# Patient Record
Sex: Male | Born: 1980 | Race: Black or African American | Hispanic: No | Marital: Married | Smoking: Current some day smoker
Health system: Southern US, Community
[De-identification: ages and names within clinical notes are randomized; demographics above are authoritative.]

## PROBLEM LIST (undated history)

## (undated) DIAGNOSIS — Z8619 Personal history of other infectious and parasitic diseases: Secondary | ICD-10-CM

## (undated) HISTORY — DX: Personal history of other infectious and parasitic diseases: Z86.19

## (undated) HISTORY — PX: PILONIDAL CYST EXCISION: SHX744

---

## 2004-06-20 ENCOUNTER — Emergency Department: Payer: Self-pay | Admitting: Emergency Medicine

## 2007-05-23 ENCOUNTER — Emergency Department: Payer: Self-pay | Admitting: Emergency Medicine

## 2007-05-25 ENCOUNTER — Emergency Department: Payer: Self-pay | Admitting: Emergency Medicine

## 2016-10-13 ENCOUNTER — Ambulatory Visit (INDEPENDENT_AMBULATORY_CARE_PROVIDER_SITE_OTHER): Payer: BLUE CROSS/BLUE SHIELD | Admitting: Internal Medicine

## 2016-10-13 ENCOUNTER — Encounter: Payer: Self-pay | Admitting: Internal Medicine

## 2016-10-13 VITALS — BP 120/80 | HR 71 | Temp 98.2°F | Wt 175.0 lb

## 2016-10-13 DIAGNOSIS — Z0000 Encounter for general adult medical examination without abnormal findings: Secondary | ICD-10-CM

## 2016-10-13 DIAGNOSIS — M79672 Pain in left foot: Secondary | ICD-10-CM

## 2016-10-13 DIAGNOSIS — K219 Gastro-esophageal reflux disease without esophagitis: Secondary | ICD-10-CM

## 2016-10-13 DIAGNOSIS — M79671 Pain in right foot: Secondary | ICD-10-CM

## 2016-10-13 LAB — CBC
HEMATOCRIT: 44.8 % (ref 38.5–50.0)
Hemoglobin: 15.1 g/dL (ref 13.2–17.1)
MCH: 32.3 pg (ref 27.0–33.0)
MCHC: 33.7 g/dL (ref 32.0–36.0)
MCV: 95.9 fL (ref 80.0–100.0)
MPV: 10 fL (ref 7.5–12.5)
Platelets: 228 10*3/uL (ref 140–400)
RBC: 4.67 MIL/uL (ref 4.20–5.80)
RDW: 12.2 % (ref 11.0–15.0)
WBC: 7.7 10*3/uL (ref 3.8–10.8)

## 2016-10-13 NOTE — Progress Notes (Signed)
HPI  Pt presents to the clinic today to establish care. [facility]  He c/o bilateral foot pain. This has been going on for years. The pain is in his bilateral big toes and under his arch. He has been seen for this in the past and told it was a combination of arthritis and plantar fasciitis. He is not taking anything OTC for this.  Flu: never Tetanus >10 years ago Dentist: as needed  Diet: He does eat meat. He consumes fruits and veggies daily. He tries to avoid fried foods. He drinks mostly water. Exercise: He goes to the gym 4-5 days a week.  Past Medical History:  Diagnosis Date  . History of shingles     Current Outpatient Prescriptions  Medication Sig Dispense Refill  . Multiple Vitamin (MULTIVITAMIN) tablet Take 1 tablet by mouth daily.     No current facility-administered medications for this visit.     No Known Allergies  No family history on file.  Social History   Social History  . Marital status: Married    Spouse name: N/A  . Number of children: N/A  . Years of education: N/A   Occupational History  . Not on file.   Social History Main Topics  . Smoking status: Never Smoker  . Smokeless tobacco: Never Used  . Alcohol use Yes     Comment: occasional  . Drug use: Unknown  . Sexual activity: Not on file   Other Topics Concern  . Not on file   Social History Narrative  . No narrative on file    ROS:  Constitutional: Denies fever, malaise, fatigue, headache or abrupt weight changes.  HEENT: Denies eye pain, eye redness, ear pain, ringing in the ears, wax buildup, runny nose, nasal congestion, bloody nose, or sore throat. Respiratory: Denies difficulty breathing, shortness of breath, cough or sputum production.   Cardiovascular: Denies chest pain, chest tightness, palpitations or swelling in the hands or feet.  Gastrointestinal: Pt reports reflux, daily. Denies abdominal pain, bloating, constipation, diarrhea or  blood in the stool.  GU: Denies frequency, urgency, pain with urination, blood in urine, odor or discharge. Musculoskeletal: Pt reports bilateral foot pain. Denies decrease in range of motion, difficulty with gait, muscle pain or joint swelling.  Skin: Denies redness, rashes, lesions or ulcercations.  Neurological: Denies dizziness, difficulty with memory, difficulty with speech or problems with balance and coordination.  Psych: Denies anxiety, depression, SI/HI.  No other specific complaints in a complete review of systems (except as listed in HPI above).  PE:  BP 120/80   Pulse 71   Temp 98.2 F (36.8 C) (Oral)   Wt 175 lb (79.4 kg)   SpO2 98%  Wt Readings from Last 3 Encounters:  10/13/16 175 lb (79.4 kg)    General: Appears his stated age, well developed, well nourished in NAD. HEENT: Head: normal shape and size; Eyes: sclera white, no icterus, conjunctiva pink, PERRLA and EOMs intact; Ears: Tm's gray and intact, normal light reflex; Throat/Mouth: Teeth present, mucosa pink and moist, no lesions or ulcerations noted.  Neck: Neck supple, trachea midline. No masses, lumps or thyromegaly present.  Cardiovascular: Normal rate and rhythm. S1,S2 noted.  No murmur, rubs or gallops noted.  Pulmonary/Chest: Normal effort and positive vesicular breath sounds. No respiratory distress. No wheezes, rales or ronchi noted.  Abdomen: Soft and nontender. Normal bowel sounds. No distention or masses noted. Liver, spleen and kidneys non palpable. Musculoskeletal: Strength 5/5 BUE/BLE. No  difficulty with gait.  Neurological: Alert and oriented. Cranial nerves II-XII grossly intact. Coordination normal.  Psychiatric: Mood and affect normal. Behavior is normal. Judgment and thought content normal.    Assessment and Plan:  Preventative Health Maintenance:  He declines flu or tetanus today Encouraged him to consume a balanced diet and exercise regimen Advised him to see a dentist  annually  GERD:  Try to identify triggers and avoid them Advised him to start Zantac 150 mg QHS  Bilateral Foot Pain:  Advised him to wear shoe inserts Take Aleve once daily  RTC in 1 year, sooner if needed Nicki Reaper, NP

## 2016-10-13 NOTE — Patient Instructions (Signed)

## 2016-10-14 LAB — COMPREHENSIVE METABOLIC PANEL
ALK PHOS: 44 U/L (ref 40–115)
ALT: 21 U/L (ref 9–46)
AST: 24 U/L (ref 10–40)
Albumin: 4.3 g/dL (ref 3.6–5.1)
BILIRUBIN TOTAL: 0.5 mg/dL (ref 0.2–1.2)
BUN: 13 mg/dL (ref 7–25)
CALCIUM: 9.3 mg/dL (ref 8.6–10.3)
CO2: 26 mmol/L (ref 20–31)
CREATININE: 1.38 mg/dL — AB (ref 0.60–1.35)
Chloride: 105 mmol/L (ref 98–110)
GLUCOSE: 79 mg/dL (ref 65–99)
Potassium: 4 mmol/L (ref 3.5–5.3)
SODIUM: 140 mmol/L (ref 135–146)
Total Protein: 7.6 g/dL (ref 6.1–8.1)

## 2016-10-14 LAB — LIPID PANEL
Cholesterol: 197 mg/dL (ref <200)
HDL: 45 mg/dL (ref >40)
LDL CALC: 136 mg/dL — AB (ref <100)
Total CHOL/HDL Ratio: 4.4 Ratio (ref <5.0)
Triglycerides: 79 mg/dL (ref <150)
VLDL: 16 mg/dL (ref <30)

## 2017-02-13 ENCOUNTER — Encounter: Payer: Self-pay | Admitting: Internal Medicine

## 2017-02-13 ENCOUNTER — Ambulatory Visit (INDEPENDENT_AMBULATORY_CARE_PROVIDER_SITE_OTHER): Payer: 59 | Admitting: Internal Medicine

## 2017-02-13 VITALS — BP 118/80 | HR 72 | Temp 98.2°F | Wt 187.0 lb

## 2017-02-13 DIAGNOSIS — R1033 Periumbilical pain: Secondary | ICD-10-CM

## 2017-02-13 DIAGNOSIS — R141 Gas pain: Secondary | ICD-10-CM | POA: Diagnosis not present

## 2017-02-13 DIAGNOSIS — R140 Abdominal distension (gaseous): Secondary | ICD-10-CM | POA: Diagnosis not present

## 2017-02-13 DIAGNOSIS — K219 Gastro-esophageal reflux disease without esophagitis: Secondary | ICD-10-CM

## 2017-02-13 LAB — COMPREHENSIVE METABOLIC PANEL
ALK PHOS: 48 U/L (ref 39–117)
ALT: 40 U/L (ref 0–53)
AST: 28 U/L (ref 0–37)
Albumin: 3.9 g/dL (ref 3.5–5.2)
BILIRUBIN TOTAL: 0.5 mg/dL (ref 0.2–1.2)
BUN: 10 mg/dL (ref 6–23)
CO2: 32 meq/L (ref 19–32)
CREATININE: 1.18 mg/dL (ref 0.40–1.50)
Calcium: 9.2 mg/dL (ref 8.4–10.5)
Chloride: 106 mEq/L (ref 96–112)
GFR: 89.46 mL/min (ref >60.00)
GLUCOSE: 93 mg/dL (ref 70–99)
Potassium: 4.1 mEq/L (ref 3.5–5.1)
SODIUM: 141 meq/L (ref 135–145)
TOTAL PROTEIN: 7.4 g/dL (ref 6.0–8.3)

## 2017-02-13 LAB — AMYLASE: Amylase: 51 U/L (ref 27–131)

## 2017-02-13 LAB — LIPASE: Lipase: 57 U/L (ref 11.0–59.0)

## 2017-02-13 LAB — H. PYLORI ANTIBODY, IGG: H Pylori IgG: POSITIVE — AB

## 2017-02-13 MED ORDER — OMEPRAZOLE 20 MG PO CPDR: 20.0000 mg | DELAYED_RELEASE_CAPSULE | Freq: Every day | ORAL | 0 refills | Status: DC

## 2017-02-13 NOTE — Progress Notes (Signed)
Subjective:    Patient ID: Cole Boyle, male    DOB: 17-Aug-1981, 36 y.o.   MRN: [id number]  HPI  Pt presents to the clinic today with c/o reflux. He reports this has been occurring after every meal. He thinks tomato's make it worse.He reports increase gas and bloating. He reports pain around his belly button that started 3 weeks ago. He describes the pain as sharp and stabbing. The pain radiates to the right and left sides of his abdomen. He denies nausea, vomiting, diarrhea, constipation or blood in his stool. He has tried Zantac with minimal relief. He denies changes in diet or medications.  Review of Systems   Past Medical History:  Diagnosis Date  . History of shingles     Current Outpatient Prescriptions  Medication Sig Dispense Refill  . Multiple Vitamin (MULTIVITAMIN) tablet Take 1 tablet by mouth daily.     No current facility-administered medications for this visit.     No Known Allergies  Family History  Problem Relation Age of Onset  . Diabetes Father   . Kidney disease Father   . Cancer Neg Hx   . Stroke Neg Hx   . Heart disease Neg Hx     Social History   Social History  . Marital status: Married    Spouse name: N/A  . Number of children: N/A  . Years of education: N/A   Occupational History  . Not on file.   Social History Main Topics  . Smoking status: Never Smoker  . Smokeless tobacco: Never Used  . Alcohol use Yes     Comment: occasional  . Drug use: No  . Sexual activity: Yes   Other Topics Concern  . Not on file   Social History Narrative  . No narrative on file     Constitutional: Denies fever, malaise, fatigue, headache or abrupt weight changes.   Gastrointestinal: Pt reports reflux and abdominal pain. Denies bloating, constipation, diarrhea or blood in the stool.    No other specific complaints in a complete review of systems (except as listed in HPI above).  Objective:   Physical Exam   BP 118/80   Pulse 72   Temp  98.2 F (36.8 C) (Oral)   Wt 187 lb (84.8 kg)   SpO2 98%  Wt Readings from Last 3 Encounters:  02/13/17 187 lb (84.8 kg)  10/13/16 175 lb (79.4 kg)    General: Appears his stated age, in NAD. Abdomen: Soft and tender just left of the umbilicus. Hyperactive bowel sounds. No distention or masses noted.   BMET    Component Value Date/Time   NA 140 10/13/2016 1545   K 4.0 10/13/2016 1545   CL 105 10/13/2016 1545   CO2 26 10/13/2016 1545   GLUCOSE 79 10/13/2016 1545   BUN 13 10/13/2016 1545   CREATININE 1.38 (H) 10/13/2016 1545   CALCIUM 9.3 10/13/2016 1545    Lipid Panel     Component Value Date/Time   CHOL 197 10/13/2016 1545   TRIG 79 10/13/2016 1545   HDL 45 10/13/2016 1545   CHOLHDL 4.4 10/13/2016 1545   VLDL 16 10/13/2016 1545   LDLCALC 136 (H) 10/13/2016 1545    CBC    Component Value Date/Time   WBC 7.7 10/13/2016 1545   RBC 4.67 10/13/2016 1545   HGB 15.1 10/13/2016 1545   HCT 44.8 10/13/2016 1545   PLT 228 10/13/2016 1545   MCV 95.9 10/13/2016 1545   MCH 32.3 10/13/2016 1545  MCHC 33.7 10/13/2016 1545   RDW 12.2 10/13/2016 1545    Hgb A1C No results found for: HGBA1C         Assessment & Plan:   Bloating, Gas, Periumbilical Abdominal Pain and GERD:  Will check CMET, Amylase, Lipase and H Pylori today eRx for Prilosec 20 mg daily x 14 days Avoid tomato based foods Keep a food diary so that you can identify any other possible triggers  Will follow up after labs, RTC as needed or if symptoms persist or worsen Javyn Havlin, NP

## 2017-02-13 NOTE — Patient Instructions (Signed)

## 2017-02-14 ENCOUNTER — Other Ambulatory Visit: Payer: Self-pay | Admitting: Internal Medicine

## 2017-02-14 MED ORDER — PANTOPRAZOLE SODIUM 40 MG PO TBEC: 40.0000 mg | DELAYED_RELEASE_TABLET | Freq: Two times a day (BID) | ORAL | 0 refills | Status: DC

## 2017-02-14 MED ORDER — METRONIDAZOLE 500 MG PO TABS: 500.0000 mg | ORAL_TABLET | Freq: Three times a day (TID) | ORAL | 0 refills | Status: AC

## 2017-02-14 MED ORDER — CLARITHROMYCIN 500 MG PO TABS: 500.0000 mg | ORAL_TABLET | Freq: Two times a day (BID) | ORAL | 0 refills | Status: DC

## 2017-02-20 ENCOUNTER — Telehealth: Payer: Self-pay | Admitting: Internal Medicine

## 2017-02-20 NOTE — Telephone Encounter (Signed)
Patient received Melanie's message about his lab work.  Patient said he did start the medication immediately.

## 2017-03-20 ENCOUNTER — Ambulatory Visit: Payer: 59 | Admitting: Internal Medicine

## 2017-03-20 NOTE — Progress Notes (Deleted)
   Subjective:    Patient ID: Cole Boyle, male    DOB: [birth year], 36 y.o.   MRN: [id number]  HPI  Pt presents to the clinic today to follow up reflux. He was treated for H Pylori 02/2017.  Review of Systems      Past Medical History:  Diagnosis Date  . History of shingles     Current Outpatient Prescriptions  Medication Sig Dispense Refill  . clarithromycin (BIAXIN) 500 MG tablet Take 1 tablet (500 mg total) by mouth 2 (two) times daily. 28 tablet 0  . Multiple Vitamin (MULTIVITAMIN) tablet Take 1 tablet by mouth daily.    . pantoprazole (PROTONIX) 40 MG tablet Take 1 tablet (40 mg total) by mouth 2 (two) times daily. 28 tablet 0  . Protein POWD Take 1 scoop by mouth every other day.     No current facility-administered medications for this visit.     No Known Allergies  Family History  Problem Relation Age of Onset  . Diabetes Father   . Kidney disease Father   . Cancer Neg Hx   . Stroke Neg Hx   . Heart disease Neg Hx     Social History   Social History  . Marital status: Married    Spouse name: N/A  . Number of children: N/A  . Years of education: N/A   Occupational History  . Not on file.   Social History Main Topics  . Smoking status: Never Smoker  . Smokeless tobacco: Never Used  . Alcohol use Yes     Comment: occasional  . Drug use: No  . Sexual activity: Yes   Other Topics Concern  . Not on file   Social History Narrative  . No narrative on file     Constitutional: Denies fever, malaise, fatigue, headache or abrupt weight changes.  HEENT: Denies eye pain, eye redness, ear pain, ringing in the ears, wax buildup, runny nose, nasal congestion, bloody nose, or sore throat. Respiratory: Pt reports cough. Denies difficulty breathing, shortness of breath, or sputum production.   Cardiovascular: Denies chest pain, chest tightness, palpitations or swelling in the hands or feet.  Gastrointestinal: Pt reports reflux. Denies abdominal pain,  bloating, constipation, diarrhea or blood in the stool.  GU: Denies urgency, frequency, pain with urination, burning sensation, blood in urine, odor or discharge. Musculoskeletal: Denies decrease in range of motion, difficulty with gait, muscle pain or joint pain and swelling.  Skin: Denies redness, rashes, lesions or ulcercations.  Neurological: Denies dizziness, difficulty with memory, difficulty with speech or problems with balance and coordination.  Psych: Denies anxiety, depression, SI/HI.  No other specific complaints in a complete review of systems (except as listed in HPI above).  Objective:   Physical Exam        Assessment & Plan:

## 2017-03-23 ENCOUNTER — Encounter: Payer: Self-pay | Admitting: Internal Medicine

## 2017-03-23 ENCOUNTER — Ambulatory Visit (INDEPENDENT_AMBULATORY_CARE_PROVIDER_SITE_OTHER): Payer: 59 | Admitting: Internal Medicine

## 2017-03-23 VITALS — BP 120/80 | HR 65 | Temp 97.8°F | Wt 186.8 lb

## 2017-03-23 DIAGNOSIS — K219 Gastro-esophageal reflux disease without esophagitis: Secondary | ICD-10-CM | POA: Diagnosis not present

## 2017-03-23 DIAGNOSIS — A048 Other specified bacterial intestinal infections: Secondary | ICD-10-CM

## 2017-03-23 MED ORDER — PANTOPRAZOLE SODIUM 20 MG PO TBEC: 20.0000 mg | DELAYED_RELEASE_TABLET | Freq: Every day | ORAL | 2 refills | Status: DC

## 2017-03-23 MED ORDER — OMEPRAZOLE 20 MG PO CPDR: 20.0000 mg | DELAYED_RELEASE_CAPSULE | Freq: Every day | ORAL | 2 refills | Status: DC

## 2017-03-23 NOTE — Addendum Note (Signed)
Addended by: Roena MaladyEVONTENNO, Jance Siek Y on: 03/23/2017 05:25 PM   Modules accepted: Orders

## 2017-03-23 NOTE — Progress Notes (Signed)
Subjective:    Patient ID: Cole Boyle, male    DOB: 1980-11-21, 36 y.o.   MRN: [id number]  HPI  Pt presents to the clinic today to follow up GERD. He was seen 02/20/17 for the same. He was treated for H Pylori at that time with Clarithromycin, Flagyl and Protonix. Since that time, he reports he overall feels better. He has a slight sore throat and dry non productive cough. He denies runny nose, nasal congestion, post nasal drip or shortness of breath. He denies nausea, vomiting, diarrhea or constipation. He has not taken anything additional OTC. He is requesting repeat testing to see if the H Pylori is gone.  Review of Systems      Past Medical History:  Diagnosis Date  . History of shingles     Current Outpatient Prescriptions  Medication Sig Dispense Refill  . clarithromycin (BIAXIN) 500 MG tablet Take 1 tablet (500 mg total) by mouth 2 (two) times daily. 28 tablet 0  . Multiple Vitamin (MULTIVITAMIN) tablet Take 1 tablet by mouth daily.    . pantoprazole (PROTONIX) 40 MG tablet Take 1 tablet (40 mg total) by mouth 2 (two) times daily. 28 tablet 0  . Protein POWD Take 1 scoop by mouth every other day.     No current facility-administered medications for this visit.     No Known Allergies  Family History  Problem Relation Age of Onset  . Diabetes Father   . Kidney disease Father   . Cancer Neg Hx   . Stroke Neg Hx   . Heart disease Neg Hx     Social History   Social History  . Marital status: Married    Spouse name: N/A  . Number of children: N/A  . Years of education: N/A   Occupational History  . Not on file.   Social History Main Topics  . Smoking status: Never Smoker  . Smokeless tobacco: Never Used  . Alcohol use Yes     Comment: occasional  . Drug use: No  . Sexual activity: Yes   Other Topics Concern  . Not on file   Social History Narrative  . No narrative on file     Constitutional: Denies fever, malaise, fatigue, headache or abrupt  weight changes.  HEENT: Pt reports sore throat. Denies eye pain, eye redness, ear pain, ringing in the ears, wax buildup, runny nose, nasal congestion, bloody nose. Respiratory: Pt reports cough. Denies difficulty breathing, shortness of breath, or sputum production.   Cardiovascular: Denies chest pain, chest tightness, palpitations or swelling in the hands or feet.  Gastrointestinal: Pt reports reflux. Denies abdominal pain, bloating, constipation, diarrhea or blood in the stool.   No other specific complaints in a complete review of systems (except as listed in HPI above).  Objective:   Physical Exam   BP 120/80   Pulse 65   Temp 97.8 F (36.6 C) (Oral)   Wt 186 lb 12 oz (84.7 kg)   SpO2 98%  Wt Readings from Last 3 Encounters:  03/23/17 186 lb 12 oz (84.7 kg)  02/13/17 187 lb (84.8 kg)  10/13/16 175 lb (79.4 kg)    General: Appears his stated age, in NAD. HEENT:  Throat/Mouth: Teeth present, mucosa pink and moist, no exudate, lesions or ulcerations noted.  Neck:  No adenopathy noted. Cardiovascular: Normal rate and rhythm.  Pulmonary/Chest: Normal effort and positive vesicular breath sounds. No respiratory distress. No wheezes, rales or ronchi noted.  Abdomen: Soft and nontender.  Normal bowel sounds. No distention or masses noted.    BMET    Component Value Date/Time   NA 141 02/13/2017 1147   K 4.1 02/13/2017 1147   CL 106 02/13/2017 1147   CO2 32 02/13/2017 1147   GLUCOSE 93 02/13/2017 1147   BUN 10 02/13/2017 1147   CREATININE 1.18 02/13/2017 1147   CREATININE 1.38 (H) 10/13/2016 1545   CALCIUM 9.2 02/13/2017 1147    Lipid Panel     Component Value Date/Time   CHOL 197 10/13/2016 1545   TRIG 79 10/13/2016 1545   HDL 45 10/13/2016 1545   CHOLHDL 4.4 10/13/2016 1545   VLDL 16 10/13/2016 1545   LDLCALC 136 (H) 10/13/2016 1545    CBC    Component Value Date/Time   WBC 7.7 10/13/2016 1545   RBC 4.67 10/13/2016 1545   HGB 15.1 10/13/2016 1545   HCT 44.8  10/13/2016 1545   PLT 228 10/13/2016 1545   MCV 95.9 10/13/2016 1545   MCH 32.3 10/13/2016 1545   MCHC 33.7 10/13/2016 1545   RDW 12.2 10/13/2016 1545    Hgb A1C No results found for: HGBA1C         Assessment & Plan:   GERD:  Will start Prilosec 20 mg daily, eRx sent to pharmacy Take 30 minutes before meals  H Pylori:  Referral placed to GI for followup He may need H Pylori breath test vs UGI with biopsy  Return precautions discussed Nicki Reaper, NP

## 2017-03-23 NOTE — Patient Instructions (Signed)
Helicobacter Pylori Infection  Helicobacter pylori infection is an infection in the stomach that is caused by the Helicobacter pylori (H. pylori) bacteria. This type of bacteria often lives in the lining of the stomach. The infection can cause ulcers and irritation (gastritis) in some people. It is the most common cause of ulcers in the stomach (gastric ulcer) and in the upper part of the intestine (duodenal ulcer). Having this infection may also increase the risk of stomach cancer and a type of white blood cell cancer (lymphoma) that affects the stomach.  What are the causes?  H. pylori is a type of bacteria that is often found in the stomachs of healthy people. The bacteria may be passed from person to person through contact with stool or saliva. It is not known why some people develop ulcers, gastritis, or cancer from the infection.  What increases the risk?  This condition is more likely to develop in people who:  · Have family members with the infection.  · Live with many other people, such as in a dormitory.  · Are of African, Hispanic, or Asian descent.     What are the signs or symptoms?  Most people with this infection do not have symptoms. If you do have symptoms, they may include:  · Heartburn.  · Stomach pain.  · Nausea.  · Vomiting.  · Blood-tinged vomit.  · Loss of appetite.  · Bad breath.     How is this diagnosed?  This condition may be diagnosed based on your symptoms, a physical exam, and various tests. Tests may include:  · Blood tests or stool tests to check for the proteins (antibodies) that your body may produce in response to the bacteria. These tests are the best way to confirm the diagnosis.  · A breath test to check for the type of gas that the H. pylori bacteria release after breaking down a substance called urea. For the test, you are asked to drink urea. This test is often done after treatment in order to find out if the treatment worked.  · A procedure in which a thin, flexible tube  with a tiny camera at the end is placed into your stomach and upper intestine (upper endoscopy). Your health care provider may also take tissue samples (biopsy) to test for H. pylori and cancer.     How is this treated?  Treatment for this condition usually involves taking a combination of medicines (triple therapy) for a couple of weeks. Triple therapy includes one medicine to reduce the acid in your stomach and two types of antibiotic medicines. Many drug combinations have been approved for treatment. Treatment usually kills the H. pylori and reduces your risk of cancer. You may need to be tested for H. pylori again after treatment. In some cases, the treatment may need to be repeated.  Follow these instructions at home:  ·   · Take over-the-counter and prescription medicines only as told by your health care provider.  · Take your antibiotics as told by your health care provider. Do not stop taking the antibiotics even if you start to feel better.  · You can do all your usual activities and eat what you usually do.  · Take steps to prevent future infections:  ? Wash your hands often.  ? Make sure the food you eat has been properly prepared.  ? Drink water only from clean sources.  · Keep all follow-up visits as told by your health care provider. This is important.  Contact   a health care provider if:  · Your symptoms do not get better.  · Your symptoms return after treatment.  This information is not intended to replace advice given to you by your health care provider. Make sure you discuss any questions you have with your health care provider.  Document Released: 12/13/2015 Document Revised: 01/27/2016 Document Reviewed: 09/02/2014  [employer]vier Interactive Patient Education © 2018 [employer]   

## 2017-03-28 ENCOUNTER — Encounter: Payer: Self-pay | Admitting: Gastroenterology

## 2017-05-09 ENCOUNTER — Encounter: Payer: Self-pay | Admitting: Gastroenterology

## 2017-07-30 ENCOUNTER — Telehealth: Payer: Self-pay | Admitting: Internal Medicine

## 2017-07-30 NOTE — Telephone Encounter (Signed)
Referral was placed in July, after multiple phone calls and also a letter from [facility], the patient has not responded at all to any requests for a call back about the GI referral. I am cancelling the Referral at this time. If patient calls in we can refer him back.

## 2017-07-30 NOTE — Telephone Encounter (Signed)
noted 

## 2017-12-04 ENCOUNTER — Other Ambulatory Visit: Payer: 59

## 2017-12-04 ENCOUNTER — Ambulatory Visit (INDEPENDENT_AMBULATORY_CARE_PROVIDER_SITE_OTHER): Payer: Managed Care, Other (non HMO) | Admitting: Internal Medicine

## 2017-12-04 ENCOUNTER — Encounter: Payer: Self-pay | Admitting: Internal Medicine

## 2017-12-04 VITALS — BP 116/78 | HR 54 | Temp 97.9°F | Ht 69.0 in | Wt 194.0 lb

## 2017-12-04 DIAGNOSIS — Z23 Encounter for immunization: Secondary | ICD-10-CM

## 2017-12-04 DIAGNOSIS — Z0000 Encounter for general adult medical examination without abnormal findings: Secondary | ICD-10-CM | POA: Diagnosis not present

## 2017-12-04 LAB — LIPID PANEL
CHOL/HDL RATIO: 5
CHOLESTEROL: 200 mg/dL (ref 0–200)
HDL: 41.4 mg/dL (ref >39.00)
LDL CALC: 137 mg/dL — AB (ref 0–99)
NonHDL: 158.63
TRIGLYCERIDES: 109 mg/dL (ref 0.0–149.0)
VLDL: 21.8 mg/dL (ref 0.0–40.0)

## 2017-12-04 LAB — CBC
HEMATOCRIT: 44 % (ref 39.0–52.0)
HEMOGLOBIN: 14.9 g/dL (ref 13.0–17.0)
MCHC: 33.8 g/dL (ref 30.0–36.0)
MCV: 95.2 fl (ref 78.0–100.0)
PLATELETS: 258 10*3/uL (ref 150.0–400.0)
RBC: 4.63 Mil/uL (ref 4.22–5.81)
RDW: 12.4 % (ref 11.5–15.5)
WBC: 6.4 10*3/uL (ref 4.0–10.5)

## 2017-12-04 LAB — COMPREHENSIVE METABOLIC PANEL
ALBUMIN: 4 g/dL (ref 3.5–5.2)
ALT: 35 U/L (ref 0–53)
AST: 27 U/L (ref 0–37)
Alkaline Phosphatase: 52 U/L (ref 39–117)
BUN: 9 mg/dL (ref 6–23)
CALCIUM: 9.5 mg/dL (ref 8.4–10.5)
CHLORIDE: 102 meq/L (ref 96–112)
CO2: 31 meq/L (ref 19–32)
Creatinine, Ser: 1.04 mg/dL (ref 0.40–1.50)
GFR: 103.04 mL/min (ref >60.00)
Glucose, Bld: 79 mg/dL (ref 70–99)
POTASSIUM: 3.5 meq/L (ref 3.5–5.1)
Sodium: 139 mEq/L (ref 135–145)
Total Bilirubin: 0.5 mg/dL (ref 0.2–1.2)
Total Protein: 7.8 g/dL (ref 6.0–8.3)

## 2017-12-04 NOTE — Patient Instructions (Signed)

## 2017-12-04 NOTE — Progress Notes (Signed)
Subjective:    Patient ID: Cole Boyle, male    DOB: [birth year], 37 y.o.   MRN: [id number]  HPI  Pt presents to the clinic today for his annual exam.  Flu: never Tetanus: > 10 years ago Dentist: annually  Diet: He does eat meat. He consumes fruits and veggies daily. He does eat fried food. He drinks mostly coffee and water. Exercise: He does P90X a few days per week  Review of Systems      Past Medical History:  Diagnosis Date  . History of shingles     Current Outpatient Medications  Medication Sig Dispense Refill  . Multiple Vitamin (MULTIVITAMIN) tablet Take 1 tablet by mouth daily.    . pantoprazole (PROTONIX) 20 MG tablet Take 1 tablet (20 mg total) by mouth daily. 30 tablet 2  . Protein POWD Take 1 scoop by mouth every other day.     No current facility-administered medications for this visit.     No Known Allergies  Family History  Problem Relation Age of Onset  . Diabetes Father   . Kidney disease Father   . Cancer Neg Hx   . Stroke Neg Hx   . Heart disease Neg Hx     Social History   Socioeconomic History  . Marital status: Married    Spouse name: Not on file  . Number of children: Not on file  . Years of education: Not on file  . Highest education level: Not on file  Occupational History  . Not on file  Social Needs  . Financial resource strain: Not on file  . Food insecurity:    Worry: Not on file    Inability: Not on file  . Transportation needs:    Medical: Not on file    Non-medical: Not on file  Tobacco Use  . Smoking status: Never Smoker  . Smokeless tobacco: Never Used  Substance and Sexual Activity  . Alcohol use: Yes    Comment: occasional  . Drug use: No  . Sexual activity: Yes  Lifestyle  . Physical activity:    Days per week: Not on file    Minutes per session: Not on file  . Stress: Not on file  Relationships  . Social connections:    Talks on phone: Not on file    Gets together: Not on file    Attends  religious service: Not on file    Active member of club or organization: Not on file    Attends meetings of clubs or organizations: Not on file    Relationship status: Not on file  . Intimate partner violence:    Fear of current or ex partner: Not on file    Emotionally abused: Not on file    Physically abused: Not on file    Forced sexual activity: Not on file  Other Topics Concern  . Not on file  Social History Narrative  . Not on file     Constitutional: Denies fever, malaise, fatigue, headache or abrupt weight changes.  HEENT: Denies eye pain, eye redness, ear pain, ringing in the ears, wax buildup, runny nose, nasal congestion, bloody nose, or sore throat. Respiratory: Denies difficulty breathing, shortness of breath, cough or sputum production.   Cardiovascular: Denies chest pain, chest tightness, palpitations or swelling in the hands or feet.  Gastrointestinal: Denies abdominal pain, bloating, constipation, diarrhea or blood in the stool.  GU: Denies urgency, frequency, pain with urination, burning sensation, blood in urine, odor or  discharge. Musculoskeletal: Denies decrease in range of motion, difficulty with gait, muscle pain or joint pain and swelling.  Skin: Denies redness, rashes, lesions or ulcercations.  Neurological: Denies dizziness, difficulty with memory, difficulty with speech or problems with balance and coordination.  Psych: Denies anxiety, depression, SI/HI.  No other specific complaints in a complete review of systems (except as listed in HPI above).  Objective:   Physical Exam   BP 116/78   Pulse (!) 54   Temp 97.9 F (36.6 C) (Oral)   Ht 5\' 9"  (1.753 m)   Wt 194 lb (88 kg)   SpO2 98%   BMI 28.65 kg/m  Wt Readings from Last 3 Encounters:  12/04/17 194 lb (88 kg)  03/23/17 186 lb 12 oz (84.7 kg)  02/13/17 187 lb (84.8 kg)    General: Appears his stated age, well developed, well nourished in NAD. Skin: Warm, dry and intact.  HEENT: Head: normal  shape and size; Eyes: sclera white, no icterus, conjunctiva pink, PERRLA and EOMs intact; Ears: Tm's gray and intact, normal light reflex; Throat/Mouth: Teeth present, mucosa pink and moist, no exudate, lesions or ulcerations noted.  Neck:  Neck supple, trachea midline. No masses, lumps or thyromegaly present.  Cardiovascular: Normal rate and rhythm. S1,S2 noted.  No murmur, rubs or gallops noted. No JVD or BLE edema.  Pulmonary/Chest: Normal effort and positive vesicular breath sounds. No respiratory distress. No wheezes, rales or ronchi noted.  Abdomen: Soft and nontender. Normal bowel sounds. No distention or masses noted. Liver, spleen and kidneys non palpable. Musculoskeletal: Strength 5/5 BUE/BLE. No difficulty with gait.  Neurological: Alert and oriented. Cranial nerves II-XII grossly intact. Coordination normal.  Psychiatric: Mood and affect normal. Behavior is normal. Judgment and thought content normal.     BMET    Component Value Date/Time   NA 141 02/13/2017 1147   K 4.1 02/13/2017 1147   CL 106 02/13/2017 1147   CO2 32 02/13/2017 1147   GLUCOSE 93 02/13/2017 1147   BUN 10 02/13/2017 1147   CREATININE 1.18 02/13/2017 1147   CREATININE 1.38 (H) 10/13/2016 1545   CALCIUM 9.2 02/13/2017 1147    Lipid Panel     Component Value Date/Time   CHOL 197 10/13/2016 1545   TRIG 79 10/13/2016 1545   HDL 45 10/13/2016 1545   CHOLHDL 4.4 10/13/2016 1545   VLDL 16 10/13/2016 1545   LDLCALC 136 (H) 10/13/2016 1545    CBC    Component Value Date/Time   WBC 7.7 10/13/2016 1545   RBC 4.67 10/13/2016 1545   HGB 15.1 10/13/2016 1545   HCT 44.8 10/13/2016 1545   PLT 228 10/13/2016 1545   MCV 95.9 10/13/2016 1545   MCH 32.3 10/13/2016 1545   MCHC 33.7 10/13/2016 1545   RDW 12.2 10/13/2016 1545    Hgb A1C No results found for: HGBA1C         Assessment & Plan:   Preventative Health Maintenance:  He declines flu shot today Tdap today Encouraged him to consume a  balanced diet and exercise regimen Advised him to see a dentist annually Will check CBC, CMET, Lipid profile today  RTC in 1 year, sooner if needed Nicki Reaper, NP

## 2017-12-07 NOTE — Addendum Note (Signed)
Addended by: Roena MaladyEVONTENNO, Kerston Landeck Y on: 12/07/2017 10:26 AM   Modules accepted: Orders

## 2018-09-09 DIAGNOSIS — L720 Epidermal cyst: Secondary | ICD-10-CM | POA: Diagnosis not present

## 2018-12-09 ENCOUNTER — Encounter: Payer: Managed Care, Other (non HMO) | Admitting: Internal Medicine

## 2019-01-16 ENCOUNTER — Ambulatory Visit (INDEPENDENT_AMBULATORY_CARE_PROVIDER_SITE_OTHER): Payer: 59 | Admitting: Internal Medicine

## 2019-01-16 ENCOUNTER — Encounter: Payer: Self-pay | Admitting: Internal Medicine

## 2019-01-16 ENCOUNTER — Other Ambulatory Visit: Payer: Self-pay

## 2019-01-16 VITALS — BP 118/76 | HR 72 | Temp 98.2°F | Wt 190.0 lb

## 2019-01-16 DIAGNOSIS — R1031 Right lower quadrant pain: Secondary | ICD-10-CM | POA: Diagnosis not present

## 2019-01-16 LAB — COMPREHENSIVE METABOLIC PANEL
ALT: 24 U/L (ref 0–53)
AST: 22 U/L (ref 0–37)
Albumin: 4 g/dL (ref 3.5–5.2)
Alkaline Phosphatase: 50 U/L (ref 39–117)
BUN: 10 mg/dL (ref 6–23)
CO2: 28 mEq/L (ref 19–32)
Calcium: 9 mg/dL (ref 8.4–10.5)
Chloride: 104 mEq/L (ref 96–112)
Creatinine, Ser: 1.21 mg/dL (ref 0.40–1.50)
GFR: 80.92 mL/min (ref >60.00)
Glucose, Bld: 94 mg/dL (ref 70–99)
Potassium: 3.5 mEq/L (ref 3.5–5.1)
Sodium: 139 mEq/L (ref 135–145)
Total Bilirubin: 0.5 mg/dL (ref 0.2–1.2)
Total Protein: 7.3 g/dL (ref 6.0–8.3)

## 2019-01-16 LAB — POC URINALSYSI DIPSTICK (AUTOMATED)
Bilirubin, UA: NEGATIVE
Blood, UA: NEGATIVE
Glucose, UA: NEGATIVE
Nitrite, UA: NEGATIVE
Protein, UA: NEGATIVE
Spec Grav, UA: 1.025 (ref 1.010–1.025)
Urobilinogen, UA: 0.2 E.U./dL
pH, UA: 6 (ref 5.0–8.0)

## 2019-01-16 LAB — CBC
HCT: 41.2 % (ref 39.0–52.0)
Hemoglobin: 14.1 g/dL (ref 13.0–17.0)
MCHC: 34.2 g/dL (ref 30.0–36.0)
MCV: 94.7 fl (ref 78.0–100.0)
Platelets: 238 10*3/uL (ref 150.0–400.0)
RBC: 4.35 Mil/uL (ref 4.22–5.81)
RDW: 12.3 % (ref 11.5–15.5)
WBC: 6.4 10*3/uL (ref 4.0–10.5)

## 2019-01-16 LAB — LIPASE: Lipase: 45 U/L (ref 11.0–59.0)

## 2019-01-16 LAB — AMYLASE: Amylase: 57 U/L (ref 27–131)

## 2019-01-16 NOTE — Addendum Note (Signed)
Addended by: Roena Malady on: 01/16/2019 04:36 PM   Modules accepted: Orders

## 2019-01-16 NOTE — Progress Notes (Signed)
Subjective:    Patient ID: Cole Boyle, male    DOB: [birth year], 38 y.o.   MRN: [id number]  HPI  Pt presents to the clinic today with c/o right lower abdominal pain. This started 2 days ago. He describes the pain as sharp and stabbing. The pain does not radiate. It is not tender to touch. He denies nausea, vomiting, diarrhea, constipation or blood in his stool. He denies urinary complaints. He denies any injury to the area. He has not taken anything OTC for this.  Review of Systems      Past Medical History:  Diagnosis Date  . History of shingles     Current Outpatient Medications  Medication Sig Dispense Refill  . Multiple Vitamin (MULTIVITAMIN) tablet Take 1 tablet by mouth daily.     No current facility-administered medications for this visit.     No Known Allergies  Family History  Problem Relation Age of Onset  . Diabetes Father   . Kidney disease Father   . Cancer Neg Hx   . Stroke Neg Hx   . Heart disease Neg Hx     Social History   Socioeconomic History  . Marital status: Married    Spouse name: Not on file  . Number of children: Not on file  . Years of education: Not on file  . Highest education level: Not on file  Occupational History  . Not on file  Social Needs  . Financial resource strain: Not on file  . Food insecurity:    Worry: Not on file    Inability: Not on file  . Transportation needs:    Medical: Not on file    Non-medical: Not on file  Tobacco Use  . Smoking status: Never Smoker  . Smokeless tobacco: Never Used  Substance and Sexual Activity  . Alcohol use: Yes    Comment: occasional  . Drug use: No  . Sexual activity: Yes  Lifestyle  . Physical activity:    Days per week: Not on file    Minutes per session: Not on file  . Stress: Not on file  Relationships  . Social connections:    Talks on phone: Not on file    Gets together: Not on file    Attends religious service: Not on file    Active member of club or  organization: Not on file    Attends meetings of clubs or organizations: Not on file    Relationship status: Not on file  . Intimate partner violence:    Fear of current or ex partner: Not on file    Emotionally abused: Not on file    Physically abused: Not on file    Forced sexual activity: Not on file  Other Topics Concern  . Not on file  Social History Narrative  . Not on file     Constitutional: Denies fever, malaise, fatigue, headache or abrupt weight changes.  Respiratory: Denies difficulty breathing, shortness of breath, cough or sputum production.   Cardiovascular: Denies chest pain, chest tightness, palpitations or swelling in the hands or feet.  Gastrointestinal: Pt reports abdominal pain. Denies bloating, constipation, diarrhea or blood in the stool.  GU: Denies urgency, frequency, pain with urination, burning sensation, blood in urine, odor or discharge. Musculoskeletal: Denies decrease in range of motion, difficulty with gait, muscle pain or joint pain and swelling.  Skin: Denies redness, rashes, lesions or ulcercations.   No other specific complaints in a complete review of systems (except as  listed in HPI above).  Objective:   Physical Exam   BP 118/76   Pulse 72   Temp 98.2 F (36.8 C) (Oral)   Wt 190 lb (86.2 kg)   SpO2 98%   BMI 28.06 kg/m  Wt Readings from Last 3 Encounters:  01/16/19 190 lb (86.2 kg)  12/04/17 194 lb (88 kg)  03/23/17 186 lb 12 oz (84.7 kg)    General: Appears his stated age, well developed, well nourished in NAD. Skin: Warm, dry and intact. No rashes noted. Cardiovascular: Normal rate and rhythm. S1,S2 noted.  No murmur, rubs or gallops noted. Pulmonary/Chest: Normal effort and positive vesicular breath sounds. No respiratory distress. No wheezes, rales or ronchi noted.  Abdomen: Soft and tender in the RLQ slightly higher than McBurny's point. Positive Rosving. Negative Psoas. Hyperactive bowel sounds. No distention or masses noted.  Liver, spleen and kidneys non palpable. Neurological: Alert and oriented. Cranial nerves II-XII grossly intact. Coordination normal.    BMET    Component Value Date/Time   NA 139 12/04/2017 1426   K 3.5 12/04/2017 1426   CL 102 12/04/2017 1426   CO2 31 12/04/2017 1426   GLUCOSE 79 12/04/2017 1426   BUN 9 12/04/2017 1426   CREATININE 1.04 12/04/2017 1426   CREATININE 1.38 (H) 10/13/2016 1545   CALCIUM 9.5 12/04/2017 1426    Lipid Panel     Component Value Date/Time   CHOL 200 12/04/2017 1426   TRIG 109.0 12/04/2017 1426   HDL 41.40 12/04/2017 1426   CHOLHDL 5 12/04/2017 1426   VLDL 21.8 12/04/2017 1426   LDLCALC 137 (H) 12/04/2017 1426    CBC    Component Value Date/Time   WBC 6.4 12/04/2017 1426   RBC 4.63 12/04/2017 1426   HGB 14.9 12/04/2017 1426   HCT 44.0 12/04/2017 1426   PLT 258.0 12/04/2017 1426   MCV 95.2 12/04/2017 1426   MCH 32.3 10/13/2016 1545   MCHC 33.8 12/04/2017 1426   RDW 12.4 12/04/2017 1426    Hgb A1C No results found for: HGBA1C         Assessment & Plan:   RLQ Pain:  Concerning for early appendicitis Urinalysis: normal Will obtain STAT CBC, CMET, Amylase, Lipase Consider STAT CT Abdomen to r/o appendicitis pending labs  Will follow up after labs, to ER if worse

## 2019-01-16 NOTE — Patient Instructions (Signed)

## 2019-03-27 ENCOUNTER — Ambulatory Visit (INDEPENDENT_AMBULATORY_CARE_PROVIDER_SITE_OTHER): Payer: 59 | Admitting: Internal Medicine

## 2019-03-27 ENCOUNTER — Other Ambulatory Visit: Payer: Self-pay

## 2019-03-27 ENCOUNTER — Encounter: Payer: Self-pay | Admitting: Internal Medicine

## 2019-03-27 VITALS — BP 116/78 | HR 72 | Temp 98.1°F | Ht 69.0 in | Wt 195.0 lb

## 2019-03-27 DIAGNOSIS — S40262A Insect bite (nonvenomous) of left shoulder, initial encounter: Secondary | ICD-10-CM

## 2019-03-27 DIAGNOSIS — W57XXXA Bitten or stung by nonvenomous insect and other nonvenomous arthropods, initial encounter: Secondary | ICD-10-CM

## 2019-03-27 DIAGNOSIS — Z0001 Encounter for general adult medical examination with abnormal findings: Secondary | ICD-10-CM

## 2019-03-27 DIAGNOSIS — Z0000 Encounter for general adult medical examination without abnormal findings: Secondary | ICD-10-CM | POA: Diagnosis not present

## 2019-03-27 LAB — COMPREHENSIVE METABOLIC PANEL
ALT: 35 U/L (ref 0–53)
AST: 24 U/L (ref 0–37)
Albumin: 4.1 g/dL (ref 3.5–5.2)
Alkaline Phosphatase: 46 U/L (ref 39–117)
BUN: 12 mg/dL (ref 6–23)
CO2: 29 mEq/L (ref 19–32)
Calcium: 9.4 mg/dL (ref 8.4–10.5)
Chloride: 104 mEq/L (ref 96–112)
Creatinine, Ser: 1.18 mg/dL (ref 0.40–1.50)
GFR: 83.22 mL/min (ref >60.00)
Glucose, Bld: 90 mg/dL (ref 70–99)
Potassium: 4.4 mEq/L (ref 3.5–5.1)
Sodium: 139 mEq/L (ref 135–145)
Total Bilirubin: 0.5 mg/dL (ref 0.2–1.2)
Total Protein: 7 g/dL (ref 6.0–8.3)

## 2019-03-27 LAB — LIPID PANEL
Cholesterol: 210 mg/dL — ABNORMAL HIGH (ref 0–200)
HDL: 38.9 mg/dL — ABNORMAL LOW (ref >39.00)
LDL Cholesterol: 146 mg/dL — ABNORMAL HIGH (ref 0–99)
NonHDL: 171.05
Total CHOL/HDL Ratio: 5
Triglycerides: 127 mg/dL (ref 0.0–149.0)
VLDL: 25.4 mg/dL (ref 0.0–40.0)

## 2019-03-27 LAB — CBC
HCT: 43.7 % (ref 39.0–52.0)
Hemoglobin: 14.6 g/dL (ref 13.0–17.0)
MCHC: 33.4 g/dL (ref 30.0–36.0)
MCV: 96.1 fl (ref 78.0–100.0)
Platelets: 207 10*3/uL (ref 150.0–400.0)
RBC: 4.55 Mil/uL (ref 4.22–5.81)
RDW: 12.4 % (ref 11.5–15.5)
WBC: 5.7 10*3/uL (ref 4.0–10.5)

## 2019-03-27 NOTE — Patient Instructions (Signed)

## 2019-03-27 NOTE — Progress Notes (Signed)
Subjective:    Patient ID: Cole Boyle, male    DOB: 1981/06/17, 38 y.o.   MRN: [id number]  HPI  Patient presents to the clinic today for his annual physical exam.  He had an insect bite to his left shoulder about a week ago while painting the porch that made him feel achy and sick for a few days.  It has since resolved without intervention.  He says he can still feel a raised area and would like to have it checked.  Flu: never Tetanus: 12/2017 Dentist: biannually Vision: as needed  Diet:  He does eat meat, fruits and veggies, some fried foods.  Drinks juice, water, Gatorade. Exercise: Lifts weights 2-3 times per week.  Review of Systems  Past Medical History:  Diagnosis Date  . History of shingles     Current Outpatient Medications  Medication Sig Dispense Refill  . Multiple Vitamin (MULTIVITAMIN) tablet Take 1 tablet by mouth daily.     No current facility-administered medications for this visit.     No Known Allergies  Family History  Problem Relation Age of Onset  . Diabetes Father   . Kidney disease Father   . Cancer Neg Hx   . Stroke Neg Hx   . Heart disease Neg Hx     Social History   Socioeconomic History  . Marital status: Married    Spouse name: Not on file  . Number of children: Not on file  . Years of education: Not on file  . Highest education level: Not on file  Occupational History  . Not on file  Social Needs  . Financial resource strain: Not on file  . Food insecurity    Worry: Not on file    Inability: Not on file  . Transportation needs    Medical: Not on file    Non-medical: Not on file  Tobacco Use  . Smoking status: Never Smoker  . Smokeless tobacco: Never Used  Substance and Sexual Activity  . Alcohol use: Yes    Comment: occasional  . Drug use: No  . Sexual activity: Yes  Lifestyle  . Physical activity    Days per week: Not on file    Minutes per session: Not on file  . Stress: Not on file  Relationships  . Social  Herbalist on phone: Not on file    Gets together: Not on file    Attends religious service: Not on file    Active member of club or organization: Not on file    Attends meetings of clubs or organizations: Not on file    Relationship status: Not on file  . Intimate partner violence    Fear of current or ex partner: Not on file    Emotionally abused: Not on file    Physically abused: Not on file    Forced sexual activity: Not on file  Other Topics Concern  . Not on file  Social History Narrative  . Not on file     Constitutional: Denies fever, malaise, fatigue, headache or abrupt weight changes.  HEENT: Denies eye pain, eye redness, ear pain, ringing in the ears, wax buildup, runny nose, nasal congestion, bloody nose, or sore throat. Respiratory: Denies difficulty breathing, shortness of breath, cough or sputum production.   Cardiovascular: Denies chest pain, chest tightness, palpitations or swelling in the hands or feet.  Gastrointestinal: Denies abdominal pain, bloating, constipation, diarrhea or blood in the stool.  GU: Denies urgency, frequency,  pain with urination, burning sensation, blood in urine, odor or discharge. Musculoskeletal: Denies decrease in range of motion, difficulty with gait, muscle pain or joint pain and swelling.  Skin: Pt reports insect bite to left shoulder .Denies rashes, or ulcercations.  Neurological: Denies dizziness, difficulty with memory, difficulty with speech or problems with balance and coordination.  Psych: Denies anxiety, depression, SI/HI.  No other specific complaints in a complete review of systems (except as listed in HPI above).     Objective:   Physical Exam  BP 116/78   Pulse 72   Temp 98.1 F (36.7 C) (Temporal)   Ht 5\' 9"  (1.753 m)   Wt 195 lb (88.5 kg)   SpO2 98%   BMI 28.80 kg/m   Wt Readings from Last 3 Encounters:  01/16/19 190 lb (86.2 kg)  12/04/17 194 lb (88 kg)  03/23/17 186 lb 12 oz (84.7 kg)     General: Appears his stated age, well developed, well nourished in NAD. Skin: Small quarter-sized, erythematous, raised area with 2 puncture sites noted to left posterior shoulder. HEENT: Head: normal shape and size; Eyes: sclera white, no icterus, conjunctiva pink, PERRLA and EOMs intact; Ears: Tm's gray and intact, normal light reflex; Neck:  Neck supple, trachea midline. No masses, lumps or thyromegaly present.  Cardiovascular: Normal rate and rhythm. S1,S2 noted.  No murmur, rubs or gallops noted. No JVD or BLE edema.  Pulmonary/Chest: Normal effort and positive vesicular breath sounds. No respiratory distress. No wheezes, rales or ronchi noted.  Abdomen: Soft and nontender. Normal bowel sounds. No distention or masses noted. Liver, spleen and kidneys non palpable. Musculoskeletal: Strength 5/5 BUE/BLE. No difficulty with gait.  Neurological: Alert and oriented. Cranial nerves II-XII grossly intact. Coordination normal.  Psychiatric: Mood and affect normal. Behavior is normal. Judgment and thought content normal.     BMET    Component Value Date/Time   NA 139 01/16/2019 1533   K 3.5 01/16/2019 1533   CL 104 01/16/2019 1533   CO2 28 01/16/2019 1533   GLUCOSE 94 01/16/2019 1533   BUN 10 01/16/2019 1533   CREATININE 1.21 01/16/2019 1533   CREATININE 1.38 (H) 10/13/2016 1545   CALCIUM 9.0 01/16/2019 1533    Lipid Panel     Component Value Date/Time   CHOL 200 12/04/2017 1426   TRIG 109.0 12/04/2017 1426   HDL 41.40 12/04/2017 1426   CHOLHDL 5 12/04/2017 1426   VLDL 21.8 12/04/2017 1426   LDLCALC 137 (H) 12/04/2017 1426    CBC    Component Value Date/Time   WBC 6.4 01/16/2019 1533   RBC 4.35 01/16/2019 1533   HGB 14.1 01/16/2019 1533   HCT 41.2 01/16/2019 1533   PLT 238.0 01/16/2019 1533   MCV 94.7 01/16/2019 1533   MCH 32.3 10/13/2016 1545   MCHC 34.2 01/16/2019 1533   RDW 12.3 01/16/2019 1533    Hgb A1C No results found for: HGBA1C          Assessment &  Plan:   Preventative Health Maintenance:  Encouraged him to get a flu shot in the fall- he declines Tetanus UTD Encouraged him to see a dentist annually Will check CBC, CMET, Lipid profile today  Insect Bite of Left Shoulder:  Resolving, no infection suspected. Continue to monitor for increase in redness, drainage, or fever.   RTC in 1 year, sooner if needed Nicki Reaperegina Nikolette Reindl, NP

## 2019-08-07 ENCOUNTER — Ambulatory Visit: Payer: 59 | Admitting: Internal Medicine

## 2019-08-19 ENCOUNTER — Ambulatory Visit (INDEPENDENT_AMBULATORY_CARE_PROVIDER_SITE_OTHER): Payer: 59 | Admitting: Internal Medicine

## 2019-08-19 ENCOUNTER — Encounter: Payer: Self-pay | Admitting: Internal Medicine

## 2019-08-19 DIAGNOSIS — R0602 Shortness of breath: Secondary | ICD-10-CM | POA: Diagnosis not present

## 2019-08-19 DIAGNOSIS — R059 Cough, unspecified: Secondary | ICD-10-CM

## 2019-08-19 DIAGNOSIS — K219 Gastro-esophageal reflux disease without esophagitis: Secondary | ICD-10-CM | POA: Diagnosis not present

## 2019-08-19 DIAGNOSIS — R05 Cough: Secondary | ICD-10-CM | POA: Diagnosis not present

## 2019-08-19 DIAGNOSIS — J029 Acute pharyngitis, unspecified: Secondary | ICD-10-CM

## 2019-08-19 MED ORDER — OMEPRAZOLE 20 MG PO CPDR: 20.0000 mg | DELAYED_RELEASE_CAPSULE | Freq: Every day | ORAL | 0 refills | Status: DC

## 2019-08-19 NOTE — Progress Notes (Signed)
Virtual Visit via Video Note  I connected with Cole Boyle on 08/19/19 at  3:00 PM EST by a video enabled telemedicine application and verified that I am speaking with the correct person using two identifiers.  Location: Patient: Home Provider: Office   I discussed the limitations of evaluation and management by telemedicine and the availability of in person appointments. The patient expressed understanding and agreed to proceed.  History of Present Illness:  Pt reports sore throat, cough and SOB. He reports this started 3 weeks ago. He denies difficulty swallowing but reports the outside of his throat is tender to touch. The cough is nonproductive. The SOB is worse when he lays down. He reports some nausea and loose stools at times, but not sure if it is related to his URI symptoms. He went to [facility]. Covid test was negative. He was given Prednisone and Azithromycin which he took with minimal improvement. He does not smoke and has no history of asthma. He does have reflux, for which he typically just takes ginger with good relief. He has not tried anything OTC for this.   Past Medical History:  Diagnosis Date  . History of shingles     Current Outpatient Medications  Medication Sig Dispense Refill  . Multiple Vitamin (MULTIVITAMIN) tablet Take 1 tablet by mouth daily.     No current facility-administered medications for this visit.    No Known Allergies  Family History  Problem Relation Age of Onset  . Diabetes Father   . Kidney disease Father   . Cancer Neg Hx   . Stroke Neg Hx   . Heart disease Neg Hx     Social History   Socioeconomic History  . Marital status: Married    Spouse name: Not on file  . Number of children: Not on file  . Years of education: Not on file  . Highest education level: Not on file  Occupational History  . Not on file  Tobacco Use  . Smoking status: Never Smoker  . Smokeless tobacco: Never Used  Substance and Sexual Activity   . Alcohol use: Yes    Comment: occasional  . Drug use: No  . Sexual activity: Yes  Other Topics Concern  . Not on file  Social History Narrative  . Not on file   Social Determinants of Health   Financial Resource Strain:   . Difficulty of Paying Living Expenses: Not on file  Food Insecurity:   . Worried About Charity fundraiser in the Last Year: Not on file  . Ran Out of Food in the Last Year: Not on file  Transportation Needs:   . Lack of Transportation (Medical): Not on file  . Lack of Transportation (Non-Medical): Not on file  Physical Activity:   . Days of Exercise per Week: Not on file  . Minutes of Exercise per Session: Not on file  Stress:   . Feeling of Stress : Not on file  Social Connections:   . Frequency of Communication with Friends and Family: Not on file  . Frequency of Social Gatherings with Friends and Family: Not on file  . Attends Religious Services: Not on file  . Active Member of Clubs or Organizations: Not on file  . Attends Archivist Meetings: Not on file  . Marital Status: Not on file  Intimate Partner Violence:   . Fear of Current or Ex-Partner: Not on file  . Emotionally Abused: Not on file  .  Physically Abused: Not on file  . Sexually Abused: Not on file     Constitutional: Denies fever, malaise, fatigue, headache or abrupt weight changes.  HEENT: Pt reports sore throat. Denies eye pain, eye redness, ear pain, ringing in the ears, wax buildup, runny nose, nasal congestion, bloody nose. Respiratory: Denies difficulty breathing, shortness of breath, cough or sputum production.   Cardiovascular: Denies chest pain, chest tightness, palpitations or swelling in the hands or feet.  Gastrointestinal: Pt reports nausea, loose stools. Denies abdominal pain, bloating, constipation, diarrhea.      No other specific complaints in a complete review of systems (except as listed in HPI above).    Observations/Objective:   Wt Readings from  Last 3 Encounters:  03/27/19 195 lb (88.5 kg)  01/16/19 190 lb (86.2 kg)  12/04/17 194 lb (88 kg)    General: Appears his stated age, well developed, well nourished in NAD. HEENT: Head: normal shape and size; Nose: No congestion noted. Throat: No hoarseness noted.  Pulmonary/Chest: Normal effort. No respiratory distress. No cough noted. Neurological: Alert and oriented.   BMET    Component Value Date/Time   NA 139 03/27/2019 1040   K 4.4 03/27/2019 1040   CL 104 03/27/2019 1040   CO2 29 03/27/2019 1040   GLUCOSE 90 03/27/2019 1040   BUN 12 03/27/2019 1040   CREATININE 1.18 03/27/2019 1040   CREATININE 1.38 (H) 10/13/2016 1545   CALCIUM 9.4 03/27/2019 1040    Lipid Panel     Component Value Date/Time   CHOL 210 (H) 03/27/2019 1040   TRIG 127.0 03/27/2019 1040   HDL 38.90 (L) 03/27/2019 1040   CHOLHDL 5 03/27/2019 1040   VLDL 25.4 03/27/2019 1040   LDLCALC 146 (H) 03/27/2019 1040    CBC    Component Value Date/Time   WBC 5.7 03/27/2019 1040   RBC 4.55 03/27/2019 1040   HGB 14.6 03/27/2019 1040   HCT 43.7 03/27/2019 1040   PLT 207.0 03/27/2019 1040   MCV 96.1 03/27/2019 1040   MCH 32.3 10/13/2016 1545   MCHC 33.4 03/27/2019 1040   RDW 12.4 03/27/2019 1040    Hgb A1C No results found for: HGBA1C      Assessment and Plan:  Sore Throat, Cough, SOB, GERD:  UC notes, labs reviewed Will start Omeprazole 20 mg daily x 2 weeks- 30 minutes before breakfast Consider starting Zyrtec and Flonase OTC If no improvement, consider ENT referral  Return precautions discussed  Follow Up Instructions:    I discussed the assessment and treatment plan with the patient. The patient was provided an opportunity to ask questions and all were answered. The patient agreed with the plan and demonstrated an understanding of the instructions.   The patient was advised to call back or seek an in-person evaluation if the symptoms worsen or if the condition fails to improve as  anticipated.     Nicki Reaper, NP

## 2019-08-20 NOTE — Patient Instructions (Signed)

## 2020-01-20 ENCOUNTER — Telehealth: Payer: Self-pay

## 2020-01-20 NOTE — Telephone Encounter (Signed)
Pt s wife (DPR signed) left v/m that pt was recently dx + covid and is there any med pt should be taking. I spoke with pts wife and pt was dx with + covid on 01/15/20.pts temp is 103 now;pt is chilling,muscle & joint pain, generalized weakness, prod cough with white phlegm, rash on outer part of rt foot,pt having abd pain on rt side above waist, pt having watery diarrhea x 2 since 01/19/20; fatigue also. No other covid symptoms. No H/A,SOB or dry mouth or dizziness. pts wife said pt is in a certain amt of distress due to fever, generalized weakness and fatigue and abd pain. Advised pt to get plenty of rest, drink plenty of fluids and tylenol for fever. Pt will go to [facility] ED for eval. pts wife will cb with update.

## 2020-01-21 NOTE — Telephone Encounter (Signed)
Noted, will review ER note once completed

## 2020-01-22 ENCOUNTER — Encounter (HOSPITAL_COMMUNITY): Payer: Self-pay

## 2020-01-22 ENCOUNTER — Emergency Department (HOSPITAL_COMMUNITY): Payer: 59

## 2020-01-22 ENCOUNTER — Telehealth: Payer: Self-pay | Admitting: Unknown Physician Specialty

## 2020-01-22 ENCOUNTER — Emergency Department (HOSPITAL_COMMUNITY)
Admission: EM | Admit: 2020-01-22 | Discharge: 2020-01-22 | Disposition: A | Discharge status: Home / Self Care | Payer: 59 | Attending: Emergency Medicine | Admitting: Emergency Medicine

## 2020-01-22 DIAGNOSIS — E860 Dehydration: Secondary | ICD-10-CM | POA: Diagnosis not present

## 2020-01-22 DIAGNOSIS — J1282 Pneumonia due to coronavirus disease 2019: Secondary | ICD-10-CM

## 2020-01-22 DIAGNOSIS — U071 COVID-19: Secondary | ICD-10-CM | POA: Diagnosis not present

## 2020-01-22 DIAGNOSIS — Z79899 Other long term (current) drug therapy: Secondary | ICD-10-CM | POA: Insufficient documentation

## 2020-01-22 DIAGNOSIS — R7401 Elevation of levels of liver transaminase levels: Secondary | ICD-10-CM | POA: Insufficient documentation

## 2020-01-22 DIAGNOSIS — R0602 Shortness of breath: Secondary | ICD-10-CM | POA: Diagnosis present

## 2020-01-22 LAB — CBC
HCT: 42.9 % (ref 39.0–52.0)
Hemoglobin: 14.1 g/dL (ref 13.0–17.0)
MCH: 31.8 pg (ref 26.0–34.0)
MCHC: 32.9 g/dL (ref 30.0–36.0)
MCV: 96.8 fL (ref 80.0–100.0)
Platelets: 146 10*3/uL — ABNORMAL LOW (ref 150–400)
RBC: 4.43 MIL/uL (ref 4.22–5.81)
RDW: 11.3 % — ABNORMAL LOW (ref 11.5–15.5)
WBC: 7.3 10*3/uL (ref 4.0–10.5)
nRBC: 0 % (ref 0.0–0.2)

## 2020-01-22 LAB — COMPREHENSIVE METABOLIC PANEL
ALT: 105 U/L — ABNORMAL HIGH (ref 0–44)
AST: 116 U/L — ABNORMAL HIGH (ref 15–41)
Albumin: 3.8 g/dL (ref 3.5–5.0)
Alkaline Phosphatase: 56 U/L (ref 38–126)
Anion gap: 10 (ref 5–15)
BUN: 12 mg/dL (ref 6–20)
CO2: 26 mmol/L (ref 22–32)
Calcium: 8.8 mg/dL — ABNORMAL LOW (ref 8.9–10.3)
Chloride: 100 mmol/L (ref 98–111)
Creatinine, Ser: 1.74 mg/dL — ABNORMAL HIGH (ref 0.61–1.24)
GFR calc Af Amer: 56 mL/min — ABNORMAL LOW (ref >60)
GFR calc non Af Amer: 48 mL/min — ABNORMAL LOW (ref >60)
Glucose, Bld: 111 mg/dL — ABNORMAL HIGH (ref 70–99)
Potassium: 4 mmol/L (ref 3.5–5.1)
Sodium: 136 mmol/L (ref 135–145)
Total Bilirubin: 1.4 mg/dL — ABNORMAL HIGH (ref 0.3–1.2)
Total Protein: 7.7 g/dL (ref 6.5–8.1)

## 2020-01-22 LAB — URINALYSIS, ROUTINE W REFLEX MICROSCOPIC
Bacteria, UA: NONE SEEN
Bilirubin Urine: NEGATIVE
Glucose, UA: NEGATIVE mg/dL
Hgb urine dipstick: NEGATIVE
Ketones, ur: NEGATIVE mg/dL
Leukocytes,Ua: NEGATIVE
Nitrite: NEGATIVE
Protein, ur: 30 mg/dL — AB
Specific Gravity, Urine: 1.013 (ref 1.005–1.030)
pH: 5 (ref 5.0–8.0)

## 2020-01-22 LAB — LIPASE, BLOOD: Lipase: 68 U/L — ABNORMAL HIGH (ref 11–51)

## 2020-01-22 MED ORDER — ONDANSETRON HCL 4 MG/2ML IJ SOLN
4.0000 mg | Freq: Once | INTRAMUSCULAR | Status: AC
  Administered 2020-01-22: 4 mg via INTRAVENOUS
  Filled 2020-01-22: qty 2

## 2020-01-22 MED ORDER — SODIUM CHLORIDE 0.9 % IV BOLUS
1000.0000 mL | Freq: Once | INTRAVENOUS | Status: AC
  Administered 2020-01-22: 1000 mL via INTRAVENOUS

## 2020-01-22 MED ORDER — ALBUTEROL SULFATE HFA 108 (90 BASE) MCG/ACT IN AERS
2.0000 | INHALATION_SPRAY | Freq: Once | RESPIRATORY_TRACT | Status: AC
  Administered 2020-01-22: 2 via RESPIRATORY_TRACT
  Filled 2020-01-22: qty 6.7

## 2020-01-22 MED ORDER — MORPHINE SULFATE (PF) 4 MG/ML IV SOLN
4.0000 mg | Freq: Once | INTRAVENOUS | Status: AC
  Administered 2020-01-22: 4 mg via INTRAVENOUS
  Filled 2020-01-22: qty 1

## 2020-01-22 MED ORDER — BENZONATATE 100 MG PO CAPS
100.0000 mg | ORAL_CAPSULE | Freq: Once | ORAL | Status: AC
  Administered 2020-01-22: 100 mg via ORAL
  Filled 2020-01-22: qty 1

## 2020-01-22 MED ORDER — SODIUM CHLORIDE 0.9% FLUSH: 3.0000 mL | Freq: Once | INTRAVENOUS | Status: DC

## 2020-01-22 MED ORDER — ACETAMINOPHEN 325 MG PO TABS
650.0000 mg | ORAL_TABLET | Freq: Once | ORAL | Status: AC
  Administered 2020-01-22: 650 mg via ORAL
  Filled 2020-01-22: qty 2

## 2020-01-22 MED ORDER — ONDANSETRON HCL 4 MG PO TABS
4.0000 mg | ORAL_TABLET | Freq: Three times a day (TID) | ORAL | 0 refills | Status: DC | PRN
Start: 2020-01-22 — End: 2020-04-27

## 2020-01-22 MED ORDER — BENZONATATE 100 MG PO CAPS
100.0000 mg | ORAL_CAPSULE | Freq: Two times a day (BID) | ORAL | 0 refills | Status: DC | PRN
Start: 2020-01-22 — End: 2020-04-27

## 2020-01-22 NOTE — ED Notes (Signed)
Patient able to ambulate in room with no assistance; patient endorsed feeling slightly lightheaded, Sp02 remained 94-97% on RA, NAD.

## 2020-01-22 NOTE — Discharge Instructions (Signed)
You have evidence of Covid pneumonia on chest xray.  You are also are dehydrated.  Please drink plenty of liquids at home.  Take antinausea medication as needed.  Take cough medication for cough.  Alternate between Tylenol and ibuprofen for fever and aches.  Return to the ER promptly if you develop worsening shortness of breath, persistent vomiting, or any other concerns. Use albuterol inhaler 2 puffs every 4 hours as needed for shortness of breath.

## 2020-01-22 NOTE — ED Notes (Signed)
Patient verbalizes understanding of discharge instructions. Opportunity for questioning and answers were provided. Armband removed by staff, pt discharged from ED an ambulated to hallway to leave for home with family.

## 2020-01-22 NOTE — Telephone Encounter (Signed)
Called to discuss with Cole Boyle about Covid symptoms and the use of bamlanivimab, a monoclonal antibody infusion for those with mild to moderate Covid symptoms and at a high risk of hospitalization.     Pt is qualified for this infusion at the [facility] infusion center due to co-morbid conditions and/or a member of an at-risk group.  I talked to his wife about the treatment and she will discuss with him when he is out of the ER.   however declines infusion at this time. Patient's wife advised to call back if he decides that he does want to get infusion. Callback number to the infusion center given.    There are no problems to display for this patient.   Day 8 of illness

## 2020-01-22 NOTE — ED Notes (Signed)
Maintain 02 sat between 95-97 while ambulating

## 2020-01-22 NOTE — ED Triage Notes (Addendum)
Pt arrives to ED w/ c/o sob, fatigue, cough, fever, 9/10 abdominal pain, n/v/d, and weakness. Pt states he tested pos for covid last Thursday.

## 2020-01-22 NOTE — ED Provider Notes (Signed)
[facility] EMERGENCY DEPARTMENT Provider Note   CSN: [id number] Arrival date & time: 01/22/20  1047     History Chief Complaint  Patient presents with  . Shortness of Breath    Cole Boyle is a 39 y.o. male.  The history is provided by the patient. No language interpreter was used.  Shortness of Breath    39 year old male recently diagnosed with COVID-19 presenting complaining of not feeling well.  Patient developed symptoms approximately 8 days ago.  Symptoms include fever, chills, body aches, headache, persistent cough, back pain and decrease in appetite.  For the past 2 to 3 days he also endorsed nausea, has vomited several episodes today of nonbloody nonbilious vomitus as well as having persistent loose stools.  He feels dehydrated.  He denies any specific treatment tried at home aside from rest.  He believes he may have been exposed to sick contact on Mother's Day.  He does smoke cigar on occasion denies heavy alcohol use.  No history of asthma, COPD, hypertension.  Described abdominal pain as crampy achy sensation, moderate in severity worse with coughing.  Past Medical History:  Diagnosis Date  . History of shingles     There are no problems to display for this patient.   Past Surgical History:  Procedure Laterality Date  . PILONIDAL CYST EXCISION         Family History  Problem Relation Age of Onset  . Diabetes Father   . Kidney disease Father   . Cancer Neg Hx   . Stroke Neg Hx   . Heart disease Neg Hx     Social History   Tobacco Use  . Smoking status: Never Smoker  . Smokeless tobacco: Never Used  Substance Use Topics  . Alcohol use: Yes    Comment: occasional  . Drug use: No    Home Medications Prior to Admission medications   Medication Sig Start Date End Date Taking? Authorizing Provider  Multiple Vitamin (MULTIVITAMIN) tablet Take 1 tablet by mouth daily.    [provider]  omeprazole (PRILOSEC) 20 MG capsule  Take 1 capsule (20 mg total) by mouth daily. 08/19/19   Lorre Munroe, NP    Allergies    Patient has no known allergies.  Review of Systems   Review of Systems  Respiratory: Positive for shortness of breath.   All other systems reviewed and are negative.   Physical Exam Updated Vital Signs BP 109/69 (BP Location: Right Arm)   Pulse 81   Temp (!) 102.7 F (39.3 C) (Oral)   Ht 5\' 9"  (1.753 m)   Wt 88.5 kg   BMI 28.80 kg/m   Physical Exam Vitals and nursing note reviewed.  Constitutional:      General: He is not in acute distress.    Appearance: He is well-developed.  HENT:     Head: Atraumatic.  Eyes:     Conjunctiva/sclera: Conjunctivae normal.  Cardiovascular:     Rate and Rhythm: Normal rate and regular rhythm.  Pulmonary:     Effort: Pulmonary effort is normal.     Breath sounds: Normal breath sounds. No decreased breath sounds, wheezing, rhonchi or rales.  Chest:     Chest wall: No tenderness.  Abdominal:     Palpations: Abdomen is soft.     Tenderness: There is abdominal tenderness (Mild generalized tenderness without guarding or rebound tenderness.).  Musculoskeletal:     Cervical back: Neck supple.     Right lower leg:  No edema.     Left lower leg: No edema.  Skin:    Findings: No rash.  Neurological:     Mental Status: He is alert and oriented to person, place, and time.  Psychiatric:        Mood and Affect: Mood normal.     ED Results / Procedures / Treatments   Labs (all labs ordered are listed, but only abnormal results are displayed) Labs Reviewed  LIPASE, BLOOD - Abnormal; Notable for the following components:      Result Value   Lipase 68 (*)    All other components within normal limits  COMPREHENSIVE METABOLIC PANEL - Abnormal; Notable for the following components:   Glucose, Bld 111 (*)    Creatinine, Ser 1.74 (*)    Calcium 8.8 (*)    AST 116 (*)    ALT 105 (*)    Total Bilirubin 1.4 (*)    GFR calc non Af Amer 48 (*)    GFR  calc Af Amer 56 (*)    All other components within normal limits  CBC - Abnormal; Notable for the following components:   RDW 11.3 (*)    Platelets 146 (*)    All other components within normal limits  URINALYSIS, ROUTINE W REFLEX MICROSCOPIC    EKG EKG Interpretation  Date/Time:  Thursday Jan 22 2020 11:13:17 EDT Ventricular Rate:  81 PR Interval:  134 QRS Duration: 80 QT Interval:  352 QTC Calculation: 408 R Axis:   -45 Text Interpretation: Normal sinus rhythm Left anterior fascicular block Nonspecific T wave abnormality No previous tracing Confirmed by Blanchie Dessert [phone]) on 01/22/2020 11:53:44 AM   Radiology DG Chest Portable 1 View  Result Date: 01/22/2020 CLINICAL DATA:  Shortness of breath.  History of COVID-19 positive EXAM: PORTABLE CHEST 1 VIEW COMPARISON:  None. FINDINGS: There is ill-defined opacity in each lung base. Lungs elsewhere are clear. Heart size and pulmonary vascularity are normal. No adenopathy. No bone lesions. IMPRESSION: Ill-defined airspace opacity in each lung base consistent with bibasilar pneumonia. Suspect atypical organism pneumonia given the history. Lungs elsewhere clear. Cardiac silhouette normal. No adenopathy. Electronically Signed   By: Lowella Grip III M.D.   On: 01/22/2020 12:07    Procedures Procedures (including critical care time)  Medications Ordered in ED Medications  sodium chloride flush (NS) 0.9 % injection 3 mL (has no administration in time range)  acetaminophen (TYLENOL) tablet 650 mg (650 mg Oral Given 01/22/20 1119)  sodium chloride 0.9 % bolus 1,000 mL (1,000 mLs Intravenous New Bag/Given 01/22/20 1243)  ondansetron (ZOFRAN) injection 4 mg (4 mg Intravenous Given 01/22/20 1243)  morphine 4 MG/ML injection 4 mg (4 mg Intravenous Given 01/22/20 1243)    ED Course  I have reviewed the triage vital signs and the nursing notes.  Pertinent labs & imaging results that were available during my care of the patient were  reviewed by me and considered in my medical decision making (see chart for details).    MDM Rules/Calculators/A&P                      BP 110/69 (BP Location: Right Arm)   Pulse 67   Temp 100 F (37.8 C) (Oral)   Resp 16   Ht 5\' 9"  (1.753 m)   Wt 88.5 kg   SpO2 98%   BMI 28.80 kg/m   Final Clinical Impression(s) / ED Diagnoses Final diagnoses:  Pneumonia due to COVID-19 virus  Dehydration  Transaminitis    Rx / DC Orders ED Discharge Orders         Ordered    benzonatate (TESSALON) 100 MG capsule  2 times daily PRN     01/22/20 1505    ondansetron (ZOFRAN) 4 MG tablet  Every 8 hours PRN     01/22/20 1505         12:19 PM Patient presents with signs and Symptoms of COVID-19 which include fever, persistent cough and now nausea, diarrhea.  Fortunately patient is not hypoxic, satting at 97% on room air.  He has a very benign abdominal exam.  Will provide symptomatic treatment.  1:57 PM  fever did improve with Tylenol.  Vital signs stable.  When ambulating, O2 sats remains at 94 to 97%.  Evidence of AKI with Cr 1.74, IVF given.  Pt able to tolerates PO.  Mild transminitis AST 116, ALT 105 consistent with covid-19 infection. Mildly elevated lipase of 68 however no significant abdominal discomfort on exam to suggest pancreatitis. CXR shows bibasilar pneumonia suggestive of COVID-19 pneumonia.  At this time, patient does not meet criteria to be admitted.  He also does not meet criteria for MAB.  Will discharge home with medication to help with symptoms.  However, his condition may worsen and may require hospitalization, therefore pt was given strict return precaution.   Jeralyn Bennett was evaluated in Emergency Department on 01/22/2020 for the symptoms described in the history of present illness. He was evaluated in the context of the global COVID-19 pandemic, which necessitated consideration that the patient might be at risk for infection with the SARS-CoV-2 virus that causes  COVID-19. Institutional protocols and algorithms that pertain to the evaluation of patients at risk for COVID-19 are in a state of rapid change based on information released by regulatory bodies including the CDC and federal and state organizations. These policies and algorithms were followed during the patient's care in the ED.    Fayrene Helper, PA-C 01/22/20 1514    Gwyneth Sprout, MD 01/23/20 1023

## 2020-01-24 ENCOUNTER — Emergency Department
Admission: EM | Admit: 2020-01-24 | Discharge: 2020-01-24 | Disposition: A | Discharge status: Home / Self Care | Payer: 59 | Attending: Emergency Medicine | Admitting: Emergency Medicine

## 2020-01-24 ENCOUNTER — Other Ambulatory Visit: Payer: Self-pay

## 2020-01-24 ENCOUNTER — Encounter: Payer: Self-pay | Admitting: Emergency Medicine

## 2020-01-24 ENCOUNTER — Emergency Department: Payer: 59

## 2020-01-24 DIAGNOSIS — J189 Pneumonia, unspecified organism: Secondary | ICD-10-CM | POA: Insufficient documentation

## 2020-01-24 DIAGNOSIS — R112 Nausea with vomiting, unspecified: Secondary | ICD-10-CM | POA: Insufficient documentation

## 2020-01-24 DIAGNOSIS — F17290 Nicotine dependence, other tobacco product, uncomplicated: Secondary | ICD-10-CM | POA: Insufficient documentation

## 2020-01-24 DIAGNOSIS — Z79899 Other long term (current) drug therapy: Secondary | ICD-10-CM | POA: Diagnosis not present

## 2020-01-24 DIAGNOSIS — R531 Weakness: Secondary | ICD-10-CM | POA: Diagnosis present

## 2020-01-24 DIAGNOSIS — J1282 Pneumonia due to coronavirus disease 2019: Secondary | ICD-10-CM

## 2020-01-24 DIAGNOSIS — U071 COVID-19: Secondary | ICD-10-CM

## 2020-01-24 DIAGNOSIS — R1011 Right upper quadrant pain: Secondary | ICD-10-CM | POA: Insufficient documentation

## 2020-01-24 LAB — PROCALCITONIN: Procalcitonin: 0.31 ng/mL

## 2020-01-24 LAB — URINALYSIS, COMPLETE (UACMP) WITH MICROSCOPIC
Bacteria, UA: NONE SEEN
Bilirubin Urine: NEGATIVE
Glucose, UA: NEGATIVE mg/dL
Ketones, ur: NEGATIVE mg/dL
Leukocytes,Ua: NEGATIVE
Nitrite: NEGATIVE
Protein, ur: 100 mg/dL — AB
Specific Gravity, Urine: 1.013 (ref 1.005–1.030)
Squamous Epithelial / HPF: NONE SEEN (ref 0–5)
pH: 6 (ref 5.0–8.0)

## 2020-01-24 LAB — CBC
HCT: 37 % — ABNORMAL LOW (ref 39.0–52.0)
Hemoglobin: 12.9 g/dL — ABNORMAL LOW (ref 13.0–17.0)
MCH: 32 pg (ref 26.0–34.0)
MCHC: 34.9 g/dL (ref 30.0–36.0)
MCV: 91.8 fL (ref 80.0–100.0)
Platelets: 167 10*3/uL (ref 150–400)
RBC: 4.03 MIL/uL — ABNORMAL LOW (ref 4.22–5.81)
RDW: 11.5 % (ref 11.5–15.5)
WBC: 10.4 10*3/uL (ref 4.0–10.5)
nRBC: 0.2 % (ref 0.0–0.2)

## 2020-01-24 LAB — COMPREHENSIVE METABOLIC PANEL
ALT: 98 U/L — ABNORMAL HIGH (ref 0–44)
AST: 122 U/L — ABNORMAL HIGH (ref 15–41)
Albumin: 3.8 g/dL (ref 3.5–5.0)
Alkaline Phosphatase: 52 U/L (ref 38–126)
Anion gap: 12 (ref 5–15)
BUN: 15 mg/dL (ref 6–20)
CO2: 24 mmol/L (ref 22–32)
Calcium: 8.4 mg/dL — ABNORMAL LOW (ref 8.9–10.3)
Chloride: 97 mmol/L — ABNORMAL LOW (ref 98–111)
Creatinine, Ser: 1.48 mg/dL — ABNORMAL HIGH (ref 0.61–1.24)
GFR calc Af Amer: >60 mL/min (ref >60)
GFR calc non Af Amer: 59 mL/min — ABNORMAL LOW (ref >60)
Glucose, Bld: 109 mg/dL — ABNORMAL HIGH (ref 70–99)
Potassium: 3.7 mmol/L (ref 3.5–5.1)
Sodium: 133 mmol/L — ABNORMAL LOW (ref 135–145)
Total Bilirubin: 1.4 mg/dL — ABNORMAL HIGH (ref 0.3–1.2)
Total Protein: 8.1 g/dL (ref 6.5–8.1)

## 2020-01-24 LAB — TROPONIN I (HIGH SENSITIVITY)
Troponin I (High Sensitivity): 8 ng/L (ref <18)
Troponin I (High Sensitivity): 8 ng/L (ref <18)

## 2020-01-24 LAB — PROTIME-INR
INR: 1 (ref 0.8–1.2)
Prothrombin Time: 13 seconds (ref 11.4–15.2)

## 2020-01-24 LAB — LACTIC ACID, PLASMA: Lactic Acid, Venous: 1.3 mmol/L (ref 0.5–1.9)

## 2020-01-24 MED ORDER — AZITHROMYCIN 250 MG PO TABS: ORAL_TABLET | ORAL | 0 refills | Status: AC

## 2020-01-24 MED ORDER — PREDNISONE 20 MG PO TABS: 40.0000 mg | ORAL_TABLET | Freq: Every day | ORAL | 0 refills | Status: AC

## 2020-01-24 MED ORDER — ONDANSETRON HCL 4 MG/2ML IJ SOLN
4.0000 mg | Freq: Once | INTRAMUSCULAR | Status: AC
  Administered 2020-01-24: 4 mg via INTRAVENOUS
  Filled 2020-01-24: qty 2

## 2020-01-24 MED ORDER — SODIUM CHLORIDE 0.9 % IV BOLUS
1000.0000 mL | Freq: Once | INTRAVENOUS | Status: AC
  Administered 2020-01-24: 1000 mL via INTRAVENOUS

## 2020-01-24 MED ORDER — ACETAMINOPHEN 500 MG PO TABS
1000.0000 mg | ORAL_TABLET | Freq: Once | ORAL | Status: AC
  Administered 2020-01-24: 1000 mg via ORAL
  Filled 2020-01-24: qty 2

## 2020-01-24 MED ORDER — LIDOCAINE 5 % EX PTCH
1.0000 | MEDICATED_PATCH | CUTANEOUS | Status: DC
  Administered 2020-01-24: 1 via TRANSDERMAL
  Filled 2020-01-24: qty 1

## 2020-01-24 MED ORDER — ONDANSETRON 4 MG PO TBDP
4.0000 mg | ORAL_TABLET | Freq: Three times a day (TID) | ORAL | 0 refills | Status: DC | PRN
Start: 2020-01-24 — End: 2020-04-27

## 2020-01-24 NOTE — Discharge Instructions (Addendum)
Take Tylenol 1 g every 8 hours.  Take the steroids and the azithromycin to help with your shortness of breath.  Take the Zofran to help with your nausea to help drinking.  You can take Pedialyte and mix it with water.  This will have the right electrolytes in it.  It is important you stay well-hydrated.  If you develop worsening shortness of breath you should return to the ER immediately because your oxygen levels are right on the edge of require admission.  There is a chance that your oxygen levels could go lower at tomorrow or the next day requiring admission.  Hopefully over the next few days you will start feeling better given you already so far into the illness.     Person Under Monitoring Name: Cole Boyle  Location: [address] [place] [address]   Infection Prevention Recommendations for Individuals Confirmed to have, or Being Evaluated for, 2019 Novel Coronavirus (COVID-19) Infection Who Receive Care at Home  Individuals who are confirmed to have, or are being evaluated for, COVID-19 should follow the prevention steps below until a healthcare provider or local or state health department says they can return to normal activities.  Stay home except to get medical care You should restrict activities outside your home, except for getting medical care. Do not go to work, school, or public areas, and do not use public transportation or taxis.  Call ahead before visiting your doctor Before your medical appointment, call the healthcare provider and tell them that you have, or are being evaluated for, COVID-19 infection. This will help the healthcare provider's office take steps to keep other people from getting infected. Ask your healthcare provider to call the local or state health department.  Monitor your symptoms Seek prompt medical attention if your illness is worsening (e.g., difficulty breathing). Before going to your medical appointment, call the healthcare provider and  tell them that you have, or are being evaluated for, COVID-19 infection. Ask your healthcare provider to call the local or state health department.  Wear a facemask You should wear a facemask that covers your nose and mouth when you are in the same room with other people and when you visit a healthcare provider. People who live with or visit you should also wear a facemask while they are in the same room with you.  Separate yourself from other people in your home As much as possible, you should stay in a different room from other people in your home. Also, you should use a separate bathroom, if available.  Avoid sharing household items You should not share dishes, drinking glasses, cups, eating utensils, towels, bedding, or other items with other people in your home. After using these items, you should wash them thoroughly with soap and water.  Cover your coughs and sneezes Cover your mouth and nose with a tissue when you cough or sneeze, or you can cough or sneeze into your sleeve. Throw used tissues in a lined trash can, and immediately wash your hands with soap and water for at least 20 seconds or use an alcohol-based hand rub.  Wash your [employer] your hands often and thoroughly with soap and water for at least 20 seconds. You can use an alcohol-based hand sanitizer if soap and water are not available and if your hands are not visibly dirty. Avoid touching your eyes, nose, and mouth with unwashed hands.   Prevention Steps for Caregivers and Household Members of Individuals Confirmed to have, or Being  Evaluated for, COVID-19 Infection Being Cared for in the Home  If you live with, or provide care at home for, a person confirmed to have, or being evaluated for, COVID-19 infection please follow these guidelines to prevent infection:  Follow healthcare provider's instructions Make sure that you understand and can help the patient follow any healthcare provider instructions for all  care.  Provide for the patient's basic needs You should help the patient with basic needs in the home and provide support for getting groceries, prescriptions, and other personal needs.  Monitor the patient's symptoms If they are getting sicker, call his or her medical provider and tell them that the patient has, or is being evaluated for, COVID-19 infection. This will help the healthcare provider's office take steps to keep other people from getting infected. Ask the healthcare provider to call the local or state health department.  Limit the number of people who have contact with the patient If possible, have only one caregiver for the patient. Other household members should stay in another home or place of residence. If this is not possible, they should stay in another room, or be separated from the patient as much as possible. Use a separate bathroom, if available. Restrict visitors who do not have an essential need to be in the home.  Keep older adults, very young children, and other sick people away from the patient Keep older adults, very young children, and those who have compromised immune systems or chronic health conditions away from the patient. This includes people with chronic heart, lung, or kidney conditions, diabetes, and cancer.  Ensure good ventilation Make sure that shared spaces in the home have good air flow, such as from an air conditioner or an opened window, weather permitting.  Wash your hands often Wash your hands often and thoroughly with soap and water for at least 20 seconds. You can use an alcohol based hand sanitizer if soap and water are not available and if your hands are not visibly dirty. Avoid touching your eyes, nose, and mouth with unwashed hands. Use disposable paper towels to dry your hands. If not available, use dedicated cloth towels and replace them when they become wet.  Wear a facemask and gloves Wear a disposable facemask at all times in  the room and gloves when you touch or have contact with the patient's blood, body fluids, and/or secretions or excretions, such as sweat, saliva, sputum, nasal mucus, vomit, urine, or feces.  Ensure the mask fits over your nose and mouth tightly, and do not touch it during use. Throw out disposable facemasks and gloves after using them. Do not reuse. Wash your hands immediately after removing your facemask and gloves. If your personal clothing becomes contaminated, carefully remove clothing and launder. Wash your hands after handling contaminated clothing. Place all used disposable facemasks, gloves, and other waste in a lined container before disposing them with other household waste. Remove gloves and wash your hands immediately after handling these items.  Do not share dishes, glasses, or other household items with the patient Avoid sharing household items. You should not share dishes, drinking glasses, cups, eating utensils, towels, bedding, or other items with a patient who is confirmed to have, or being evaluated for, COVID-19 infection. After the person uses these items, you should wash them thoroughly with soap and water.  Wash laundry thoroughly Immediately remove and wash clothes or bedding that have blood, body fluids, and/or secretions or excretions, such as sweat, saliva, sputum, nasal mucus, vomit,  urine, or feces, on them. Wear gloves when handling laundry from the patient. Read and follow directions on labels of laundry or clothing items and detergent. In general, wash and dry with the warmest temperatures recommended on the label.  Clean all areas the individual has used often Clean all touchable surfaces, such as counters, tabletops, doorknobs, bathroom fixtures, toilets, phones, keyboards, tablets, and bedside tables, every day. Also, clean any surfaces that may have blood, body fluids, and/or secretions or excretions on them. Wear gloves when cleaning surfaces the patient has  come in contact with. Use a diluted bleach solution (e.g., dilute bleach with 1 part bleach and 10 parts water) or a household disinfectant with a label that says EPA-registered for coronaviruses. To make a bleach solution at home, add 1 tablespoon of bleach to 1 quart (4 cups) of water. For a larger supply, add  cup of bleach to 1 gallon (16 cups) of water. Read labels of cleaning products and follow recommendations provided on product labels. Labels contain instructions for safe and effective use of the cleaning product including precautions you should take when applying the product, such as wearing gloves or eye protection and making sure you have good ventilation during use of the product. Remove gloves and wash hands immediately after cleaning.  Monitor yourself for signs and symptoms of illness Caregivers and household members are considered close contacts, should monitor their health, and will be asked to limit movement outside of the home to the extent possible. Follow the monitoring steps for close contacts listed on the symptom monitoring form.   ? If you have additional questions, contact your local health department or call the epidemiologist on call at [phone] (available 24/7). ? This guidance is subject to change. For the most up-to-date guidance from [facility], please refer to their website: [email or web address]

## 2020-01-24 NOTE — ED Triage Notes (Addendum)
Pt arrived via POV with reports of being COVID + c/o weakness and unable to sleep.  Pt states he has not been able to eat as well. No respiratory distress noted on arrival.  Pt states he was dx with COVID last Thursday 5/13 at [facility] in [place].  Pt also reports vomiting, unable to keep powerade down.

## 2020-01-24 NOTE — ED Provider Notes (Signed)
[facility] Emergency Department Provider Note  ____________________________________________   First MD Initiated Contact with Patient 01/24/20 1453     (approximate)  I have reviewed the triage vital signs and the nursing notes.   HISTORY  Chief Complaint   HPI Cole Boyle is a 39 y.o. male with Covid positive on 5/13 who comes in with increasing weakness, shortness of breath, diarrhea, not eating.  Patient states that he is not been on any medicines at home.  He states that he went to and ER any [place] 2 days ago.  Patient's kidney function was elevated and he was given some IV fluids and given he was tolerating p.o. he was discharged home.  Patient states that he is continued to not do well keeping fluids down.  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  He states is a combination of not wanting to eat or drink but also being nervous that he is going to vomit.  Nothing makes it better, nothing makes it worse.  Denies having any abdominal pain associated with it.  States that he is had some lower back pain across the entire back.  No dysuria.  States he has had a little bit of shortness of breath as well, no chest pain          Past Medical History:  Diagnosis Date  . History of shingles     There are no problems to display for this patient.   Past Surgical History:  Procedure Laterality Date  . PILONIDAL CYST EXCISION      Prior to Admission medications   Medication Sig Start Date End Date Taking? Authorizing Provider  benzonatate (TESSALON) 100 MG capsule Take 1 capsule (100 mg total) by mouth 2 (two) times daily as needed for cough. 01/22/20   Fayrene Helper, PA-C  Multiple Vitamin (MULTIVITAMIN) tablet Take 1 tablet by mouth daily.    [provider]  omeprazole (PRILOSEC) 20 MG capsule Take 1 capsule (20 mg total) by mouth daily. 08/19/19   Lorre Munroe, NP  ondansetron (ZOFRAN) 4 MG tablet Take 1 tablet (4 mg total) by mouth every 8  (eight) hours as needed for nausea or vomiting. 01/22/20   Fayrene Helper, PA-C    Allergies Patient has no known allergies.  Family History  Problem Relation Age of Onset  . Diabetes Father   . Kidney disease Father   . Cancer Neg Hx   . Stroke Neg Hx   . Heart disease Neg Hx     Social History Social History   Tobacco Use  . Smoking status: Current Some Day Smoker    Types: Cigars  . Smokeless tobacco: Never Used  Substance Use Topics  . Alcohol use: Yes    Comment: occasional  . Drug use: No      Review of Systems Constitutional: No fever/chills, fever Eyes: No visual changes. ENT: No sore throat. Cardiovascular: No chest pain Respiratory: Positive for SOB Gastrointestinal: No abdominal pain.  Positive nausea, vomiting Genitourinary: Negative for dysuria. Musculoskeletal: Back pain Skin: Negative for rash. Neurological: Negative for headaches, focal weakness or numbness. All other ROS negative ____________________________________________   PHYSICAL EXAM:  VITAL SIGNS: ED Triage Vitals  Enc Vitals Group     BP 01/24/20 1426 108/65     Pulse Rate 01/24/20 1426 93     Resp 01/24/20 1426 20     Temp 01/24/20 1426 (!) 101.6 F (38.7 C)     Temp Source 01/24/20 1426 Oral  SpO2 01/24/20 1426 90 %     Weight 01/24/20 1427 197 lb (89.4 kg)     Height 01/24/20 1427 5\' 9"  (1.753 m)     Head Circumference --      Peak Flow --      Pain Score 01/24/20 1426 8     Pain Loc --      Pain Edu? --      Excl. in GC? --     Constitutional: Alert and oriented. Well appearing and in no acute distress. Eyes: Conjunctivae are normal. EOMI. Head: Atraumatic. Nose: No congestion/rhinnorhea. Mouth/Throat: Mucous membranes are moist.   Neck: No stridor. Trachea Midline. FROM Cardiovascular: Normal rate, regular rhythm. Grossly normal heart sounds.  Good peripheral circulation. Respiratory: No increased work of breathing, satting 94% on room air Gastrointestinal: Soft  and nontender. No distention. No abdominal bruits.  Musculoskeletal: No lower extremity tenderness nor edema.  No joint effusions. Neurologic:  Normal speech and language. No gross focal neurologic deficits are appreciated.  Skin:  Skin is warm, dry and intact. No rash noted. Psychiatric: Mood and affect are normal. Speech and behavior are normal. GU: Deferred   ____________________________________________   LABS (all labs ordered are listed, but only abnormal results are displayed)  Labs Reviewed  CBC - Abnormal; Notable for the following components:      Result Value   RBC 4.03 (*)    Hemoglobin 12.9 (*)    HCT 37.0 (*)    All other components within normal limits  COMPREHENSIVE METABOLIC PANEL - Abnormal; Notable for the following components:   Sodium 133 (*)    Chloride 97 (*)    Glucose, Bld 109 (*)    Creatinine, Ser 1.48 (*)    Calcium 8.4 (*)    AST 122 (*)    ALT 98 (*)    Total Bilirubin 1.4 (*)    GFR calc non Af Amer 59 (*)    All other components within normal limits  CULTURE, BLOOD (ROUTINE X 2)  CULTURE, BLOOD (ROUTINE X 2)  LACTIC ACID, PLASMA  PROTIME-INR  PROCALCITONIN  URINALYSIS, COMPLETE (UACMP) WITH MICROSCOPIC  PROCALCITONIN  TROPONIN I (HIGH SENSITIVITY)  TROPONIN I (HIGH SENSITIVITY)  TROPONIN I (HIGH SENSITIVITY)   ____________________________________________   ED ECG REPORT I, 01/26/20, the attending physician, personally viewed and interpreted this ECG.  Normal sinus rate 91, no ST elevation, no T wave inversions, normal intervals ____________________________________________  RADIOLOGY Concha Se, personally viewed and evaluated these images (plain radiographs) as part of my medical decision making, as well as reviewing the written report by the radiologist.  ED MD interpretation: Bibasilar opacities consistent with bilateral pneumonia  Official radiology report(s): DG Chest Portable 1 View  Result Date:  01/24/2020 CLINICAL DATA:  Cough, fever, COVID-19 positive 01/15/2020 EXAM: PORTABLE CHEST 1 VIEW COMPARISON:  01/22/2020 FINDINGS: Single frontal view of the chest demonstrates a stable cardiac silhouette. Basilar predominant interstitial and ground-glass opacities consistent with multifocal pneumonia, minimally progressive since prior study. No effusion or pneumothorax. No acute bony abnormality. IMPRESSION: 1. Minimal progression of basilar predominant multifocal bilateral pneumonia, compatible with history of COVID-19. Electronically Signed   By: 01/24/2020 M.D.   On: 01/24/2020 15:34   01/26/2020 ABDOMEN LIMITED RUQ  Result Date: 01/24/2020 CLINICAL DATA:  Right upper quadrant pain for 1 week, COVID-19 positive EXAM: ULTRASOUND ABDOMEN LIMITED RIGHT UPPER QUADRANT COMPARISON:  None. FINDINGS: Gallbladder: Gallbladder sludge is identified, without shadowing gallstone or cholecystitis. No gallbladder wall  thickening. Common bile duct: Diameter: 4 mm Liver: No focal lesion identified. Within normal limits in parenchymal echogenicity. Portal vein is patent on color Doppler imaging with normal direction of blood flow towards the liver. Other: None. IMPRESSION: 1. Gallbladder sludge, without evidence of cholelithiasis or cholecystitis. Electronically Signed   By: Sharlet Salina M.D.   On: 01/24/2020 18:31    ____________________________________________   PROCEDURES  Procedure(s) performed (including Critical Care):  Procedures   ____________________________________________   INITIAL IMPRESSION / ASSESSMENT AND PLAN / ED COURSE   Cole Boyle was evaluated in Emergency Department on 01/24/2020 for the symptoms described in the history of present illness. He was evaluated in the context of the global COVID-19 pandemic, which necessitated consideration that the patient might be at risk for infection with the SARS-CoV-2 virus that causes COVID-19. Institutional protocols and algorithms that pertain  to the evaluation of patients at risk for COVID-19 are in a state of rapid change based on information released by regulatory bodies including the CDC and federal and state organizations. These policies and algorithms were followed during the patient's care in the ED.     Pt presents with multiple symptoms related to his COVID-19 diagnosis.  Now on day 10.  Patient's biggest concern is his not eating or drinking.  Will get labs to evaluate for electrolyte abnormalities, AKI.  Abdomen is soft and nontender to lower suspicion for acute abdominal process.  Given his shortness of breath will get chest x-ray to evaluate for pneumonia.  His shortness of breath does not seem to be as bad as the nausea and vomiting.  I have lower suspicion for PE given he has normal heart rate but will get troponin to evaluate for strain but again it seems like the majority of symptoms are related to his known COVID-19  PNA-will get xray to evaluation Anemia-CBC to evaluate ACS- will get trops Arrhythmia-Will get EKG and keep on monitor.    Chest x-ray shows bilateral pneumonia consistent with his Covid.  Procalcitonin is slightly elevated so given a course of azithromycin and some steroids for this.  Labs shows LFTs are slightly elevated similar to yet 2 days ago, sodium and chloride are slightly low but his creatinine is downtrending from 2 days ago now at 1.48.  I rediscussed with patient he stated that he has had a little bit of right upper quadrant tenderness a few days ago.  Will get ultrasound to make sure notes of cholecystitis that could be causing his nausea vomiting.  Patient ultrasound was negative.  Patient ambulated around the room with saturations above 91% without any significant work of breathing.  Discussed with patient that from a respiratory standpoint he did not meet admission criteria.  I discussed that have low suspicion for PE given his negative troponins, normal heart rate and is looking very well  with ambulation.  Patient understands that if his shortness of breath is worsening he should return to the ER immediately.  But if he is unable to tolerate eating or drinking that we could admit him to the the hospital for continued IV nausea medicines.  Patient states that he was able to drink a cup of water after having some Zofran.  Again we discussed admission versus discharge and patient feels comfortable going home on some symptomatic treatment.  Patient understands that his shortness of breath could get worse and he may need to return to the ER tomorrow if that was the case in order to have his oxygen  levels rechecked.  Understands importance of coming back if symptoms are worsening at all.  Hope that since he is day 10 that his symptoms will start getting better over the next few days.  I discussed the provisional nature of ED diagnosis, the treatment so far, the ongoing plan of care, follow up appointments and return precautions with the patient and any family or support people present. They expressed understanding and agreed with the plan, discharged home.   ____________________________________________   FINAL CLINICAL IMPRESSION(S) / ED DIAGNOSES   Final diagnoses:  RUQ pain  COVID-19  Pneumonia due to COVID-19 virus  Nausea and vomiting, intractability of vomiting not specified, unspecified vomiting type     MEDICATIONS GIVEN DURING THIS VISIT:  Medications  lidocaine (LIDODERM) 5 % 1 patch (1 patch Transdermal Patch Applied 01/24/20 1604)  acetaminophen (TYLENOL) tablet 1,000 mg (1,000 mg Oral Given 01/24/20 1602)  ondansetron (ZOFRAN) injection 4 mg (4 mg Intravenous Given 01/24/20 1603)  sodium chloride 0.9 % bolus 1,000 mL (1,000 mLs Intravenous New Bag/Given 01/24/20 1556)     ED Discharge Orders         Ordered    ondansetron (ZOFRAN ODT) 4 MG disintegrating tablet  Every 8 hours PRN     01/24/20 1932    predniSONE (DELTASONE) 20 MG tablet  Daily     01/24/20 1932     azithromycin (ZITHROMAX Z-PAK) 250 MG tablet     01/24/20 1932           Note:  This document was prepared using Dragon voice recognition software and may include unintentional dictation errors.   Vanessa [place], MD 01/24/20 1944

## 2020-01-24 NOTE — ED Notes (Signed)
Pt ambulated around the room. Pt's Sat level remained between 91% to as high as 99%. Pt's avg Sat level was at 94%.

## 2020-01-24 NOTE — ED Notes (Signed)
Patient assisted to the bathroom. Is ready to go home. Told MD he did not want to wait for results. WIll discharge when appropriate.

## 2020-01-29 LAB — CULTURE, BLOOD (ROUTINE X 2)
Culture: NO GROWTH
Special Requests: ADEQUATE

## 2020-04-27 ENCOUNTER — Telehealth (INDEPENDENT_AMBULATORY_CARE_PROVIDER_SITE_OTHER): Payer: 59 | Admitting: Family Medicine

## 2020-04-27 ENCOUNTER — Encounter: Payer: Self-pay | Admitting: Family Medicine

## 2020-04-27 DIAGNOSIS — J029 Acute pharyngitis, unspecified: Secondary | ICD-10-CM | POA: Insufficient documentation

## 2020-04-27 MED ORDER — AMOXICILLIN 500 MG PO CAPS
500.0000 mg | ORAL_CAPSULE | Freq: Three times a day (TID) | ORAL | 0 refills | Status: DC
Start: 2020-04-27 — End: 2020-12-31

## 2020-04-27 NOTE — Patient Instructions (Addendum)
Sent in amoxicillin to your pharmacy to cover potential strep throat  Take it as directed with food Drink fluids and rest  Salt water gargle may help pain  Tylenol or ibuprofen may also help pain  Drink cold liquids  Watch for fever or headache or other new symptoms   Update if not starting to improve in a week or if worsening   If symptoms become severe and you cannot swallow your own saliva or throat feels swollen-please to to the ER

## 2020-04-27 NOTE — Progress Notes (Signed)
Virtual Visit via Video Note  I connected with Cole Boyle on 04/27/20 at  8:30 AM EDT by a video enabled telemedicine application and verified that I am speaking with the correct person using two identifiers.  Location: Patient: home Provider: office   I discussed the limitations of evaluation and management by telemedicine and the availability of in person appointments. The patient expressed understanding and agreed to proceed.  Parties involved in encounter  Patient: Cole Boyle  Provider:  Roxy Manns MD    History of Present Illness: 39 yo pt of NP Sam[facility] Si presents with c/o sore throat  Woke up with it yesterday am  It has become worse since then  Burning in the back of his throat  Does not feel swollen  Definitely hurts to swallow  Lots of sinus drainage- can hardly swallow  Does not think pain is from the drainage  Throat may look red-hard to tell   Took some tylenol yesterday for his ST Did not help much   Is tired - but works 3rd shift and thinks that is why   Has not had a fever  No body aches or chills  No runny or stuffy nose  No rash  No cough at all or sob  No swollen LN  No upset stomach    He has been around a bunch of little kids last weekend  Not aware of any sick contacts   Of note, he had covid 19 in may  Thinks he is over that Also fully vaccinated   Smoking status -current smoker  Wife said she had a mild ST this am  Patient Active Problem List   Diagnosis Date Noted  . Acute pharyngitis 04/27/2020   Past Medical History:  Diagnosis Date  . History of shingles    Past Surgical History:  Procedure Laterality Date  . PILONIDAL CYST EXCISION     Social History   Tobacco Use  . Smoking status: Current Some Day Smoker    Types: Cigars  . Smokeless tobacco: Never Used  Vaping Use  . Vaping Use: Never used  Substance Use Topics  . Alcohol use: Yes    Comment: occasional  . Drug use: No   Family History  Problem  Relation Age of Onset  . Diabetes Father   . Kidney disease Father   . Cancer Neg Hx   . Stroke Neg Hx   . Heart disease Neg Hx    No Known Allergies No current outpatient medications on file prior to visit.   No current facility-administered medications on file prior to visit.    Review of Systems  Constitutional: Negative for chills, fever and malaise/fatigue.  HENT: Positive for sore throat. Negative for congestion, ear pain and sinus pain.        Some mucous in throat- ? pnd  Eyes: Negative for blurred vision, discharge and redness.  Respiratory: Negative for cough, sputum production, shortness of breath, wheezing and stridor.   Cardiovascular: Negative for chest pain, palpitations and leg swelling.  Gastrointestinal: Negative for abdominal pain, diarrhea, nausea and vomiting.  Musculoskeletal: Negative for myalgias.  Skin: Negative for rash.  Neurological: Negative for dizziness and headaches.    Observations/Objective: Patient appears well, in no distress Weight is baseline  No facial swelling or asymmetry Voices discomfort to swallow (this is evident when he tries to) , but not unable to swallow  No tenderness when he palpates his neck  Normal voice-not hoarse and no slurred speech No  obvious tremor or mobility impairment Moving neck and UEs normally Able to hear the call well  No cough or shortness of breath during interview  Talkative and mentally sharp with no cognitive changes No skin changes on face or neck , no rash or pallor Affect is normal    Assessment and Plan: Problem List Items Addressed This Visit      Respiratory   Acute pharyngitis    Sore throat -significant, for one day  Hard to swallow due to pain but not unable, and no swelling  Pt is a smoker No other resp symptoms  Has had covid and been vaccinated  Has been exposed to children recently  Disc differential incl strep, viral pharyngitis or early uri  Since symptoms are close to severe and  no other uri indication will cover for strep with amoxicillin  inst to call if worse or not improving (esp if unable to swallow)  Also if any new symptoms  Advised to try salt water gargle, tylenol or nsaid, drink cold liquids for symptom relief          Follow Up Instructions: Sent in amoxicillin to your pharmacy to cover potential strep throat  Take it as directed with food Drink fluids and rest  Salt water gargle may help pain  Tylenol or ibuprofen may also help pain  Drink cold liquids  Watch for fever or headache or other new symptoms   Update if not starting to improve in a week or if worsening   If symptoms become severe and you cannot swallow your own saliva or throat feels swollen-please to to the ER   I discussed the assessment and treatment plan with the patient. The patient was provided an opportunity to ask questions and all were answered. The patient agreed with the plan and demonstrated an understanding of the instructions.   The patient was advised to call back or seek an in-person evaluation if the symptoms worsen or if the condition fails to improve as anticipated.    Roxy Manns, MD

## 2020-04-27 NOTE — Assessment & Plan Note (Signed)
Sore throat -significant, for one day  Hard to swallow due to pain but not unable, and no swelling  Pt is a smoker No other resp symptoms  Has had covid and been vaccinated  Has been exposed to children recently  Disc differential incl strep, viral pharyngitis or early uri  Since symptoms are close to severe and no other uri indication will cover for strep with amoxicillin  inst to call if worse or not improving (esp if unable to swallow)  Also if any new symptoms  Advised to try salt water gargle, tylenol or nsaid, drink cold liquids for symptom relief

## 2020-06-01 ENCOUNTER — Other Ambulatory Visit: Payer: Self-pay | Admitting: Internal Medicine

## 2020-06-02 NOTE — Telephone Encounter (Signed)
Spouse Corinthia called checking on rx.  Pt is out of meds  Best number [phone]

## 2020-09-17 ENCOUNTER — Other Ambulatory Visit: Payer: Self-pay | Admitting: Internal Medicine

## 2020-10-20 ENCOUNTER — Other Ambulatory Visit: Payer: Self-pay | Admitting: Internal Medicine

## 2020-11-05 ENCOUNTER — Telehealth: Payer: Self-pay

## 2020-11-05 NOTE — Telephone Encounter (Signed)
[name] - Client TELEPHONE ADVICE RECORD [facility] Patient Name: Cole Boyle Gender: Male DOB: [birth year] Age: 40 Y 5 M 18 D Return Phone Number: [phone] (Primary), [phone] (Secondary) Address: City/State/Zip: [place] [address] Client [facility] Day - Client Client Site  [facility] - Day Physician Nicki Reaper - NP Contact Type Call Who Is Calling Patient / Member / Family / Caregiver Call Type Triage / Clinical Caller Name Corinthian Shen Relationship To Patient Spouse Return Phone Number [phone] (Primary) Chief Complaint ABDOMINAL PAIN - Severe and only in abdomen Reason for Call Symptomatic / Request for Health Information Initial Comment Caller states her husband is having severe pain in his abdominal area. Translation No Nurse Assessment Nurse: Ladona Ridgel, RN, Felicia Date/Time (Eastern Time): 11/05/2020 4:27:23 PM Confirm and document reason for call. If symptomatic, describe symptoms. ---Pt is having severe abdominal pain for 2 weeks off and on. No fever. It is on the R front abdomen above naval Does the patient have any new or worsening symptoms? ---Yes Will a triage be completed? ---Yes Related visit to physician within the last 2 weeks? ---No Does the PT have any chronic conditions? (i.e. diabetes, asthma, this includes High risk factors for pregnancy, etc.) ---No Is the patient pregnant or possibly pregnant? (Ask all females between the ages of 58-55) ---No Is this a behavioral health or substance abuse call? ---No Guidelines Guideline Title Affirmed Question Affirmed Notes Nurse Date/Time (Eastern Time) Abdominal Pain - Male [1] MODERATE pain (e.g., interferes with normal activities) AND [2] pain comes and goes (cramps) AND [3] present > 24 hours (Exception: pain with Vomiting or Diarrhea - see that Guideline) Gaddy, RN, Felicia 11/05/2020 4:32:58 PM PLEASE NOTE:  All timestamps contained within this report are represented as [place] Standard Time. CONFIDENTIALTY NOTICE: This fax transmission is intended only for the addressee. It contains information that is legally privileged, confidential or otherwise protected from use or disclosure. If you are not the intended recipient, you are strictly prohibited from reviewing, disclosing, copying using or disseminating any of this information or taking any action in reliance on or regarding this information. If you have received this fax in error, please notify us immediately by telephone so that we can arrange for its return to [place]. Phone: [phone], Toll-Free: [phone], Fax: [phone] Page: 2 of 2 Call Id: [id number] Disp. Time Lamount Cohen Time) Disposition Final User 11/05/2020 4:26:31 PM Send to Urgent Queue [place] [facility] 11/05/2020 4:36:33 PM See PCP within 24 Hours Yes Gaddy, RN, Sunny Schlein Caller Disagree/Comply Comply Caller Understands Yes PreDisposition Did not know what to do Care Advice Given Per Guideline SEE PCP WITHIN 24 HOURS: * Drink adequate fluids. Eat a bland diet. * Avoid alcohol or caffeinated beverages. * Avoid greasy or fatty foods. * Severe pain lasts over 1 hour * Constant pain lasts over 2 hours * You become worse Comments User: Cole Abbot, RN Date/Time (Eastern Time): 11/05/2020 4:31:53 PM CLARIFICATION now PT got on the phone and said it started 1 month ago and it is on R side of torso instead of in the front - it is mainly slightly below naval. He carries a concealed weapon there - but has weapon moved to the Left - NO INjury Referrals GO TO FACILITY UNDECIDED

## 2020-11-05 NOTE — Telephone Encounter (Signed)
I spoke with Mrs Skoog (DPR signed) and she said that pt is going to work tonight but will go to UC either on [employer] or [facility] in [place] on 11/06/20; UC & ED precautions given again and pts wife voiced understanding. Sending note to Pamala Hurry NP who is out of office now and Dr Para March who is in office and [facility] CMA.

## 2020-11-05 NOTE — Telephone Encounter (Signed)
noted 

## 2020-11-06 NOTE — Telephone Encounter (Signed)
Agree. Thanks

## 2020-12-30 ENCOUNTER — Encounter: Payer: Self-pay | Admitting: Internal Medicine

## 2020-12-31 ENCOUNTER — Other Ambulatory Visit: Payer: Self-pay

## 2020-12-31 ENCOUNTER — Ambulatory Visit (INDEPENDENT_AMBULATORY_CARE_PROVIDER_SITE_OTHER): Payer: 59 | Admitting: Internal Medicine

## 2020-12-31 ENCOUNTER — Encounter: Payer: Self-pay | Admitting: Internal Medicine

## 2020-12-31 VITALS — BP 102/68 | HR 58 | Temp 97.3°F | Resp 18 | Ht 69.0 in | Wt 194.4 lb

## 2020-12-31 DIAGNOSIS — E7800 Pure hypercholesterolemia, unspecified: Secondary | ICD-10-CM | POA: Diagnosis not present

## 2020-12-31 DIAGNOSIS — Z1159 Encounter for screening for other viral diseases: Secondary | ICD-10-CM | POA: Diagnosis not present

## 2020-12-31 DIAGNOSIS — Z0001 Encounter for general adult medical examination with abnormal findings: Secondary | ICD-10-CM

## 2020-12-31 DIAGNOSIS — R1031 Right lower quadrant pain: Secondary | ICD-10-CM

## 2020-12-31 DIAGNOSIS — K219 Gastro-esophageal reflux disease without esophagitis: Secondary | ICD-10-CM

## 2020-12-31 MED ORDER — OMEPRAZOLE 20 MG PO CPDR: 20.0000 mg | DELAYED_RELEASE_CAPSULE | Freq: Every day | ORAL | 3 refills | Status: DC

## 2020-12-31 NOTE — Patient Instructions (Signed)

## 2020-12-31 NOTE — Progress Notes (Signed)
Subjective:    Patient ID: Cole Boyle, male    DOB: 03-05-1981, 40 y.o.   MRN: [id number]  HPI  Pt presents to the clinic today for his annual exam.  GERD: Triggered by tomato based, fried foods and alcohol. He denies breakthrough on Omeprazole. He has never had an upper endoscopy.  He reports intermittent RLQ abdominal pain. He reports this started a few months ago. There is no pattern to when it occurs. He describes the pain as sharp and stabbing. The pain does not radiate. It is not worse with movement. He denies nausea, vomiting, reflux, constipation or blood in his stool. He denies urinary complaints at this time. He denies any injury to the area but reports he was wearing his gun on his belt on that side and not sure if it sticking into his side repeatedly causes the pain. He started wearing in on the other side and the pain persists. He has not taken anything OTC for this. He has been seen at [facility] for the same.   Flu: never Tetanus: 12/2017 Covid: Moderna x 3 Vision Screening: as needed Dentist: biannually  Diet: He does eat meat. He consumes fruits and veggies. He does eat fried foods. He drinks mostly water. Exercise: Walking  Review of Systems  Past Medical History:  Diagnosis Date  . History of shingles     Current Outpatient Medications  Medication Sig Dispense Refill  . amoxicillin (AMOXIL) 500 MG capsule Take 1 capsule (500 mg total) by mouth 3 (three) times daily. With meals 21 capsule 0  . omeprazole (PRILOSEC) 20 MG capsule Take 1 capsule (20 mg total) by mouth daily. MUST SCHEDULE PHYSICAL 30 capsule 0   No current facility-administered medications for this visit.    No Known Allergies  Family History  Problem Relation Age of Onset  . Diabetes Father   . Kidney disease Father   . Cancer Neg Hx   . Stroke Neg Hx   . Heart disease Neg Hx     Social History   Socioeconomic History  . Marital status: Married    Spouse name: Not on file  . Number  of children: Not on file  . Years of education: Not on file  . Highest education level: Not on file  Occupational History  . Not on file  Tobacco Use  . Smoking status: Current Some Day Smoker    Types: Cigars  . Smokeless tobacco: Never Used  Vaping Use  . Vaping Use: Never used  Substance and Sexual Activity  . Alcohol use: Yes    Comment: occasional  . Drug use: No  . Sexual activity: Yes  Other Topics Concern  . Not on file  Social History Narrative  . Not on file   Social Determinants of Health   Financial Resource Strain: Not on file  Food Insecurity: Not on file  Transportation Needs: Not on file  Physical Activity: Not on file  Stress: Not on file  Social Connections: Not on file  Intimate Partner Violence: Not on file     Constitutional: Denies fever, malaise, fatigue, headache or abrupt weight changes.  HEENT: Denies eye pain, eye redness, ear pain, ringing in the ears, wax buildup, runny nose, nasal congestion, bloody nose, or sore throat. Respiratory: Denies difficulty breathing, shortness of breath, cough or sputum production.   Cardiovascular: Denies chest pain, chest tightness, palpitations or swelling in the hands or feet.  Gastrointestinal: Pt reports RLQ pain. Denies bloating, constipation, diarrhea or  blood in the stool.  GU: Denies urgency, frequency, pain with urination, burning sensation, blood in urine, odor or discharge. Musculoskeletal: Denies decrease in range of motion, difficulty with gait, muscle pain or joint pain and swelling.  Skin: Denies redness, rashes, lesions or ulcercations.  Neurological: Denies dizziness, difficulty with memory, difficulty with speech or problems with balance and coordination.  Psych: Denies anxiety, depression, SI/HI.  No other specific complaints in a complete review of systems (except as listed in HPI above).     Objective:   Physical Exam  BP 102/68 (BP Location: Left Arm, Patient Position: Sitting, Cuff  Size: Large)   Pulse (!) 58   Temp (!) 97.3 F (36.3 C) (Temporal)   Resp 18   Ht 5\' 9"  (1.753 m)   Wt 194 lb 6.4 oz (88.2 kg)   SpO2 99%   BMI 28.71 kg/m   Wt Readings from Last 3 Encounters:  01/24/20 197 lb (89.4 kg)  01/22/20 195 lb (88.5 kg)  03/27/19 195 lb (88.5 kg)    General: Appears his stated age, well developed, well nourished in NAD. Skin: Warm, dry and intact. No rashes noted. HEENT: Head: normal shape and size; Eyes: sclera white, no icterus, conjunctiva pink, PERRLA and EOMs intact; Neck:  Neck supple, trachea midline. No masses, lumps or thyromegaly present.  Cardiovascular: Bradycardic with normal rhythm. S1,S2 noted.  No murmur, rubs or gallops noted. No JVD or BLE edema.  Pulmonary/Chest: Normal effort and positive vesicular breath sounds. No respiratory distress. No wheezes, rales or ronchi noted.  Abdomen: Soft and nontender. Normal bowel sounds. No distention or masses noted. Liver, spleen and kidneys non palpable. Musculoskeletal: Strength 5/5 BUE/BLE. No difficulty with gait.  Neurological: Alert and oriented. Cranial nerves II-XII grossly intact. Coordination normal.  Psychiatric: Mood and affect normal. Behavior is normal. Judgment and thought content normal.     BMET    Component Value Date/Time   NA 133 (L) 01/24/2020 1439   K 3.7 01/24/2020 1439   CL 97 (L) 01/24/2020 1439   CO2 24 01/24/2020 1439   GLUCOSE 109 (H) 01/24/2020 1439   BUN 15 01/24/2020 1439   CREATININE 1.48 (H) 01/24/2020 1439   CREATININE 1.38 (H) 10/13/2016 1545   CALCIUM 8.4 (L) 01/24/2020 1439   GFRNONAA 59 (L) 01/24/2020 1439   GFRAA >60 01/24/2020 1439    Lipid Panel     Component Value Date/Time   CHOL 210 (H) 03/27/2019 1040   TRIG 127.0 03/27/2019 1040   HDL 38.90 (L) 03/27/2019 1040   CHOLHDL 5 03/27/2019 1040   VLDL 25.4 03/27/2019 1040   LDLCALC 146 (H) 03/27/2019 1040    CBC    Component Value Date/Time   WBC 10.4 01/24/2020 1431   RBC 4.03 (L)  01/24/2020 1431   HGB 12.9 (L) 01/24/2020 1431   HCT 37.0 (L) 01/24/2020 1431   PLT 167 01/24/2020 1431   MCV 91.8 01/24/2020 1431   MCH 32.0 01/24/2020 1431   MCHC 34.9 01/24/2020 1431   RDW 11.5 01/24/2020 1431    Hgb A1C No results found for: HGBA1C          Assessment & Plan:   Preventative Health Maintenance:  Encouraged him to get a flu shot in the fall Tetanus UTD Covid UTD, he will upload a copy of his card to mychart Encouraged him to consume a balanced diet and exercise regimen Advised him to see an eye doctor and dentist annually Will check CBC, CMET, Lipid, A1C, and  Hep C today  RLQ Pain:  Will obtain CT abd/pelvis for further evaluation of symptoms  RTC in 1 year, sooner if needed Nicki Reaper, NP This visit occurred during the SARS-CoV-2 public health emergency.  Safety protocols were in place, including screening questions prior to the visit, additional usage of staff PPE, and extensive cleaning of exam room while observing appropriate contact time as indicated for disinfecting solutions.

## 2020-12-31 NOTE — Assessment & Plan Note (Signed)
Avoid foods that trigger your reflux Continue Omeprazole, refilled today CBC and CMET today

## 2021-01-03 LAB — COMPREHENSIVE METABOLIC PANEL
AG Ratio: 1.3 (calc) (ref 1.0–2.5)
ALT: 27 U/L (ref 9–46)
AST: 23 U/L (ref 10–40)
Albumin: 4.3 g/dL (ref 3.6–5.1)
Alkaline phosphatase (APISO): 55 U/L (ref 36–130)
BUN: 13 mg/dL (ref 7–25)
CO2: 24 mmol/L (ref 20–32)
Calcium: 9.9 mg/dL (ref 8.6–10.3)
Chloride: 104 mmol/L (ref 98–110)
Creat: 1.18 mg/dL (ref 0.60–1.35)
Globulin: 3.3 g/dL (calc) (ref 1.9–3.7)
Glucose, Bld: 108 mg/dL — ABNORMAL HIGH (ref 65–99)
Potassium: 4.3 mmol/L (ref 3.5–5.3)
Sodium: 141 mmol/L (ref 135–146)
Total Bilirubin: 0.6 mg/dL (ref 0.2–1.2)
Total Protein: 7.6 g/dL (ref 6.1–8.1)

## 2021-01-03 LAB — CBC
HCT: 45.3 % (ref 38.5–50.0)
Hemoglobin: 15.2 g/dL (ref 13.2–17.1)
MCH: 31.2 pg (ref 27.0–33.0)
MCHC: 33.6 g/dL (ref 32.0–36.0)
MCV: 93 fL (ref 80.0–100.0)
MPV: 10.3 fL (ref 7.5–12.5)
Platelets: 256 10*3/uL (ref 140–400)
RBC: 4.87 10*6/uL (ref 4.20–5.80)
RDW: 11.3 % (ref 11.0–15.0)
WBC: 7.8 10*3/uL (ref 3.8–10.8)

## 2021-01-03 LAB — HEPATITIS C ANTIBODY
Hepatitis C Ab: NONREACTIVE
SIGNAL TO CUT-OFF: 0.01 (ref <1.00)

## 2021-01-03 LAB — LIPID PANEL
Cholesterol: 251 mg/dL — ABNORMAL HIGH (ref <200)
HDL: 42 mg/dL (ref >=40)
LDL Cholesterol (Calc): 185 mg/dL (calc) — ABNORMAL HIGH
Non-HDL Cholesterol (Calc): 209 mg/dL (calc) — ABNORMAL HIGH (ref <130)
Total CHOL/HDL Ratio: 6 (calc) — ABNORMAL HIGH (ref <5.0)
Triglycerides: 113 mg/dL (ref <150)

## 2021-01-03 LAB — HEMOGLOBIN A1C
Hgb A1c MFr Bld: 4.7 % of total Hgb (ref <5.7)
Mean Plasma Glucose: 88 mg/dL
eAG (mmol/L): 4.9 mmol/L

## 2021-01-04 NOTE — Addendum Note (Signed)
Addended by: Lorre Munroe on: 01/04/2021 12:25 PM   Modules accepted: Orders

## 2021-01-19 ENCOUNTER — Other Ambulatory Visit: Payer: Self-pay

## 2021-01-19 ENCOUNTER — Ambulatory Visit
Admission: RE | Admit: 2021-01-19 | Discharge: 2021-01-19 | Disposition: A | Discharge status: Home / Self Care | Payer: 59 | Source: Ambulatory Visit | Attending: Internal Medicine | Admitting: Internal Medicine

## 2021-01-19 DIAGNOSIS — K219 Gastro-esophageal reflux disease without esophagitis: Secondary | ICD-10-CM | POA: Diagnosis not present

## 2021-08-20 IMAGING — CT CT ABD-PELV W/O CM
2 of 4 series · 16 of 46 positions shown, 18 images · non-contrast
Comparison: None.

CLINICAL DATA: Right lower quadrant pain for 6 months.

EXAM:
CT ABDOMEN AND PELVIS WITHOUT CONTRAST
TECHNIQUE: Multidetector CT imaging of the abdomen and pelvis was performed
following the standard protocol without IV contrast.

[Series 2: axials routine abdomen pelvis without 5.00 · axial · non-contrast · 0.69mm/px · z∈[-1546,-1121]mm · 13 of 93 slices shown, 15 images]
[im 4/93  soft-tissue]
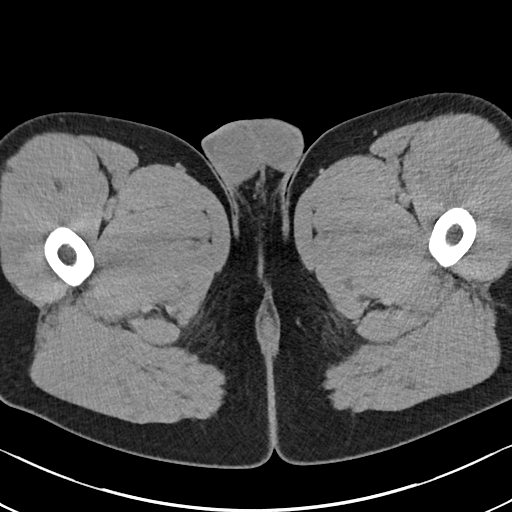
[im 4/93  bone]
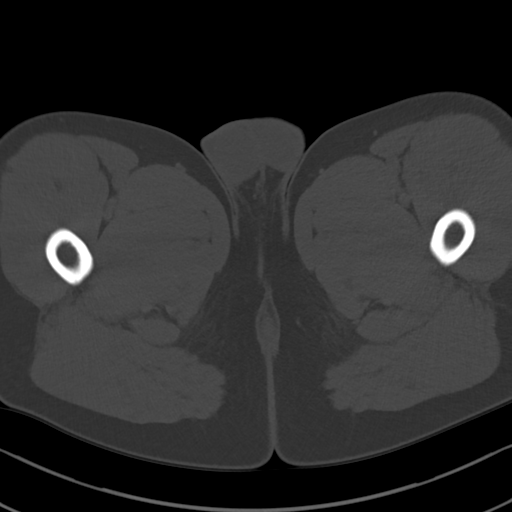
[im 12/93  soft-tissue]
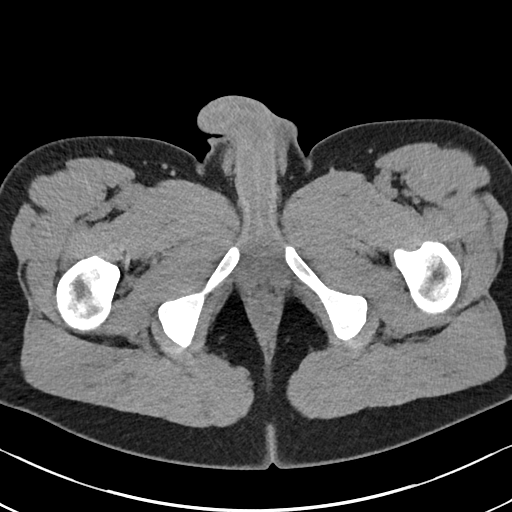
[im 19/93  soft-tissue]
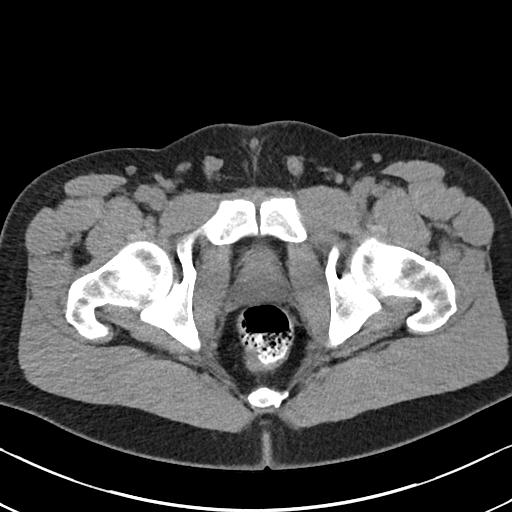
[im 26/93  soft-tissue]
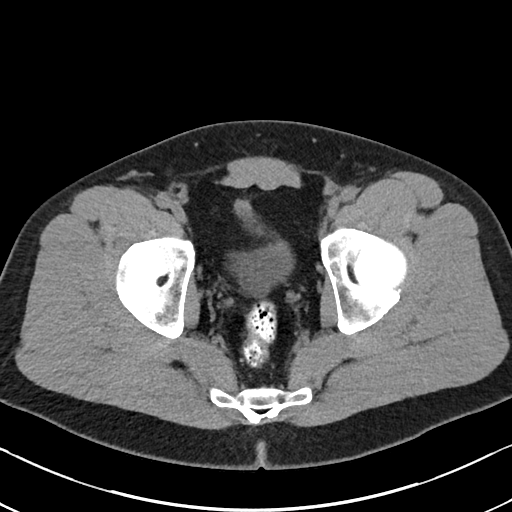
[im 34/93  soft-tissue]
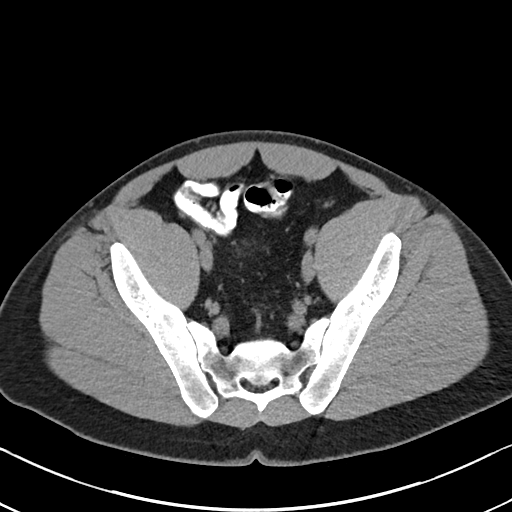
[im 41/93  soft-tissue]
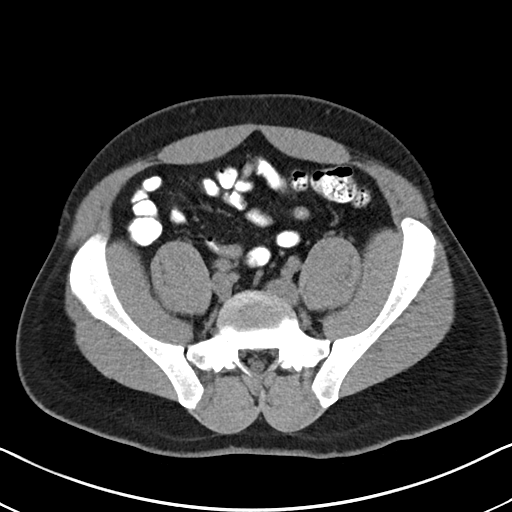
[im 48/93  soft-tissue]
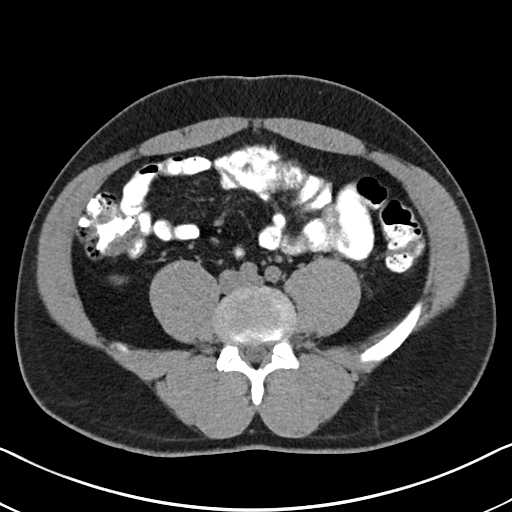
[im 52/93  soft-tissue]
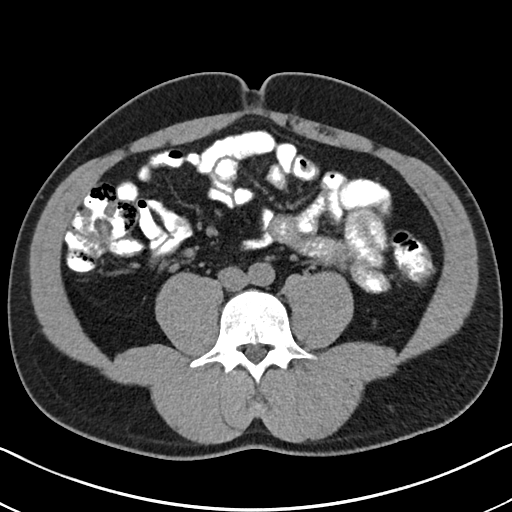
[im 59/93  soft-tissue]
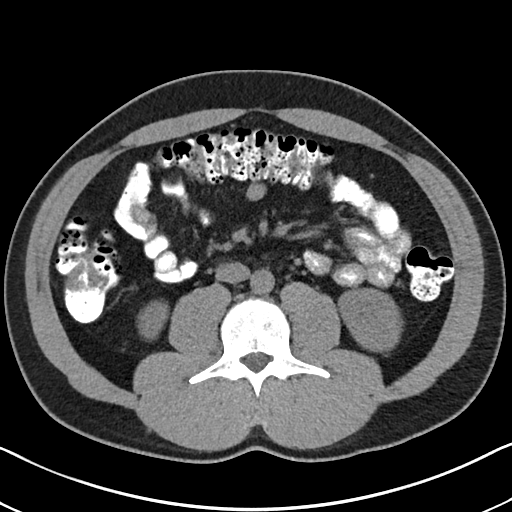
[im 59/93  bone]
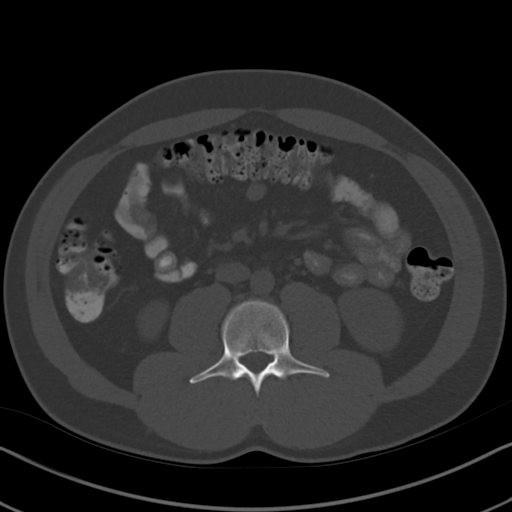
[im 67/93  soft-tissue]
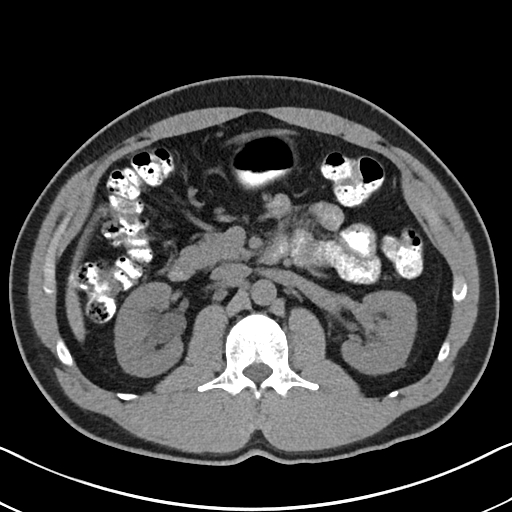
[im 74/93  soft-tissue]
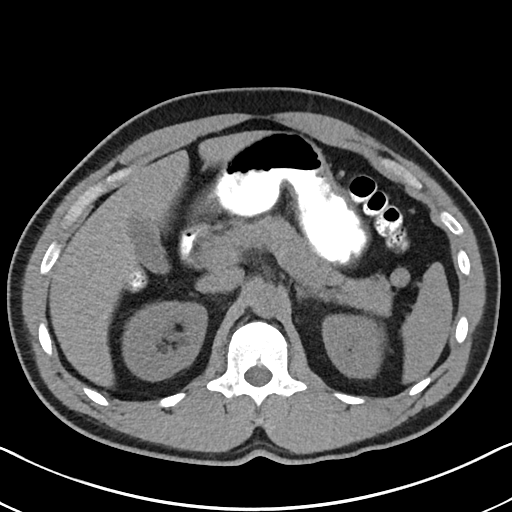
[im 81/93  soft-tissue]
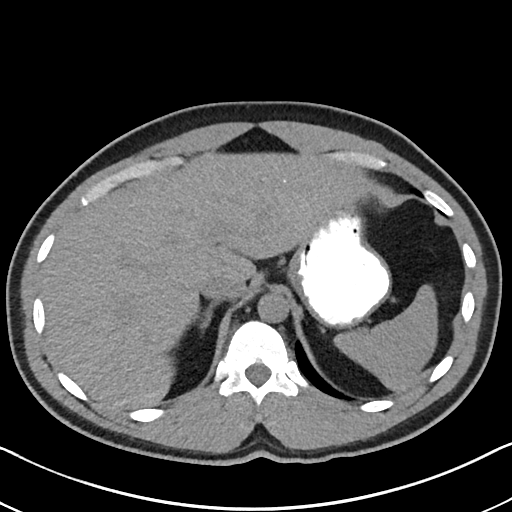
[im 89/93  soft-tissue]
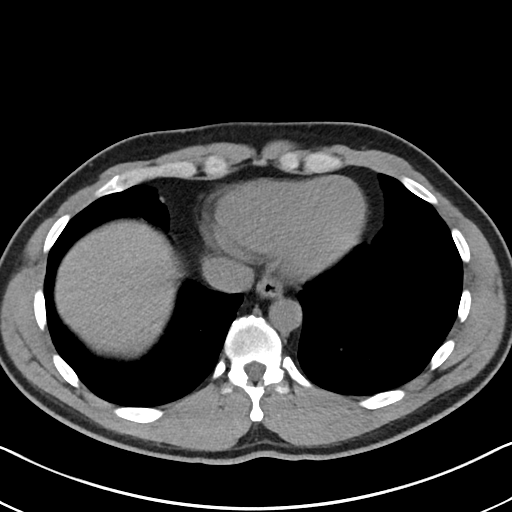

[Series 4: coronals routine abdomen pelvis without 2.00 cor · coronal · non-contrast · 0.69mm/px · 3 of 142 slices shown]
[im 48/142  soft-tissue]
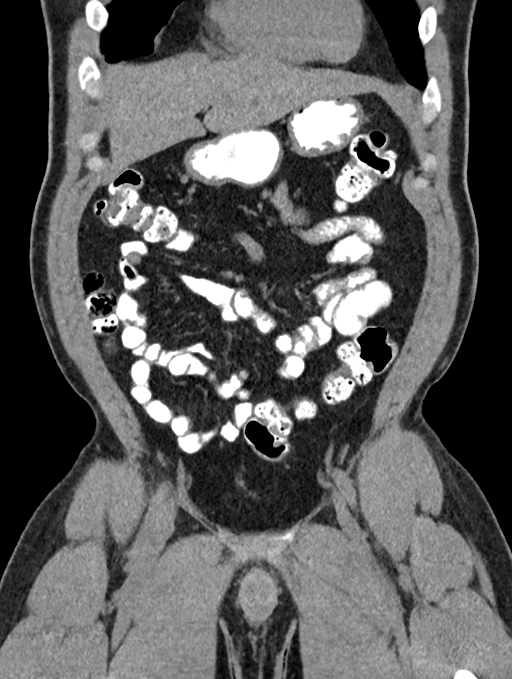
[im 63/142  soft-tissue]
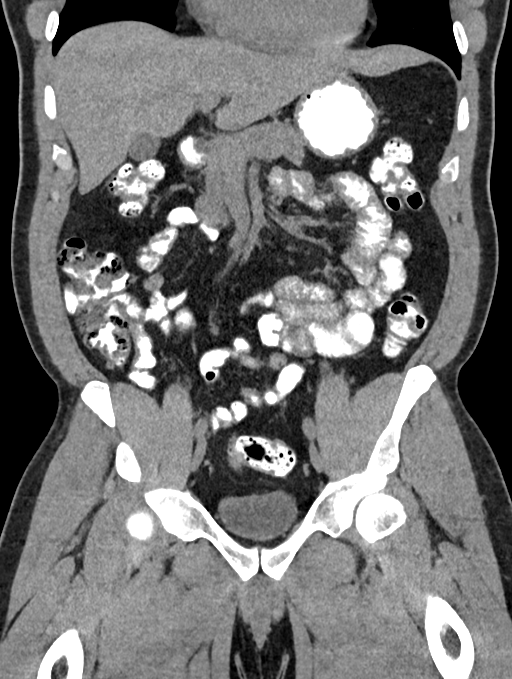
[im 79/142  soft-tissue]
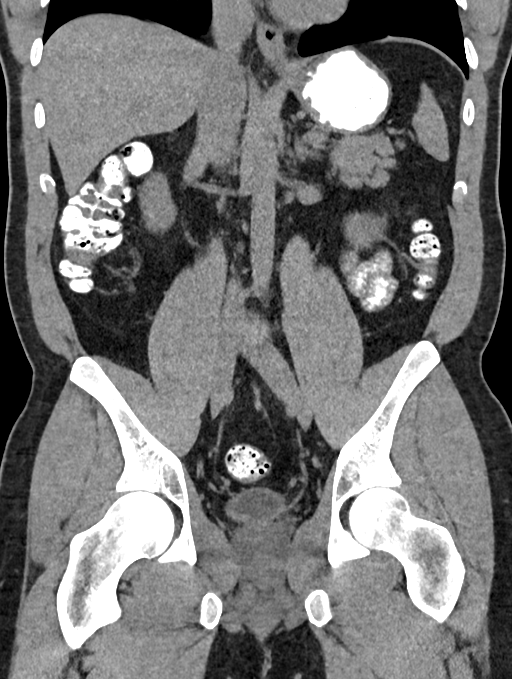

[16 of 46 positions shown; findings below may reference images not displayed]

FINDINGS: Lower chest: No acute findings.

Hepatobiliary: No mass visualized on this unenhanced exam. Tiny
sub-cm cyst noted in left lobe. Gallbladder is unremarkable. No
evidence of biliary ductal dilatation.

Pancreas: No mass or inflammatory process visualized on this
unenhanced exam.

Spleen:  Within normal limits in size.

Adrenals/Urinary tract: No evidence of urolithiasis or
hydronephrosis. Unremarkable unopacified urinary bladder.

Stomach/Bowel: No evidence of obstruction, inflammatory process, or
abnormal fluid collections. Normal appendix visualized.

Vascular/Lymphatic: No pathologically enlarged lymph nodes
identified. No evidence of abdominal aortic aneurysm.

Reproductive:  No mass or other significant abnormality.

Other:  None.

Musculoskeletal:  No suspicious bone lesions identified.
IMPRESSION: Unremarkable exam. No evidence of appendicitis or other acute
findings.

## 2021-11-24 ENCOUNTER — Other Ambulatory Visit: Payer: Self-pay

## 2021-11-24 ENCOUNTER — Ambulatory Visit (INDEPENDENT_AMBULATORY_CARE_PROVIDER_SITE_OTHER): Payer: 59 | Admitting: Physician Assistant

## 2021-11-24 ENCOUNTER — Encounter: Payer: Self-pay | Admitting: Physician Assistant

## 2021-11-24 VITALS — BP 122/74 | HR 60 | Temp 98.7°F | Wt 200.2 lb

## 2021-11-24 DIAGNOSIS — H60501 Unspecified acute noninfective otitis externa, right ear: Secondary | ICD-10-CM | POA: Diagnosis not present

## 2021-11-24 MED ORDER — HYDROCORTISONE-ACETIC ACID 1-2 % OT SOLN: 3.0000 [drp] | Freq: Two times a day (BID) | OTIC | 0 refills | Status: DC

## 2021-11-24 NOTE — Progress Notes (Signed)
? ? ?  ?    Acute Office Visit ? ? ?Patient: Cole Boyle   DOB: [birth year]   41 y.o. Male  MRN: [id number] ?Visit Date: 11/24/2021 ? ?Today's healthcare provider: Oswaldo Conroy Charm Stenner, PA-C  ?Introduced myself to the patient as a Secondary school teacher and provided education on APPs in clinical practice.  ? ? ?Chief Complaint  ?Patient presents with  ? Sore Throat  ? Ear Pain  ? ?Subjective  ?  ?HPI  ? ?Patient states he started having issues with sore throat and ear pain ?Denies overall ill feeling or generalized concerns ? ?States pain is mostly along the right jaw line  ?Sore throat has mostly resolved but thinks he may have some swelling on right jaw line ?Denies recent sick contacts  ? ? ?Medications: ?Outpatient Medications Prior to Visit  ?Medication Sig  ? omeprazole (PRILOSEC) 20 MG capsule Take 1 capsule (20 mg total) by mouth daily. MUST SCHEDULE PHYSICAL  ? ?No facility-administered medications prior to visit.  ? ? ?Review of Systems  ?Constitutional:  Negative for chills, diaphoresis, fatigue and fever.  ?HENT:  Positive for ear pain, sore throat and trouble swallowing. Negative for congestion, rhinorrhea, sinus pressure and sinus pain.   ?Eyes:  Negative for discharge, itching and visual disturbance.  ?Respiratory:  Negative for cough and shortness of breath.   ?Cardiovascular:  Negative for chest pain and palpitations.  ?Gastrointestinal:  Negative for diarrhea, nausea and vomiting.  ?Musculoskeletal:  Negative for myalgias.  ?Neurological:  Negative for dizziness, syncope, weakness, light-headedness and headaches.  ? ? ?  Objective  ?  ?BP 122/74   Pulse 60   Temp 98.7 ?F (37.1 ?C) (Oral)   Wt 200 lb 3.2 oz (90.8 kg)   SpO2 98%   BMI 29.56 kg/m?  ? ? ?Physical Exam ?Vitals reviewed.  ?Constitutional:   ?   Appearance: He is well-developed.  ?HENT:  ?   Head: Normocephalic and atraumatic.  ?   Right Ear: Tympanic membrane and external ear normal. Tympanic membrane is not injected, scarred, perforated, erythematous,  retracted or bulging.  ?   Left Ear: Tympanic membrane, ear canal and external ear normal. Tympanic membrane is not injected, scarred, perforated, erythematous, retracted or bulging.  ?   Ears:  ?   Comments: Mild erythema in right ear canal  ?   Nose: Nose normal.  ?   Mouth/Throat:  ?   Mouth: Mucous membranes are moist.  ?   Pharynx: Oropharynx is clear. Uvula midline. No oropharyngeal exudate or posterior oropharyngeal erythema.  ?   Tonsils: No tonsillar exudate or tonsillar abscesses.  ?Cardiovascular:  ?   Rate and Rhythm: Normal rate and regular rhythm.  ?   Pulses: Normal pulses.  ?   Heart sounds: Normal heart sounds.  ?Pulmonary:  ?   Effort: Pulmonary effort is normal.  ?   Breath sounds: Normal breath sounds. No decreased air movement. No decreased breath sounds, wheezing, rhonchi or rales.  ?Musculoskeletal:  ?   Right lower leg: No edema.  ?   Left lower leg: No edema.  ?Neurological:  ?   Mental Status: He is alert.  ?  ? ? ?No results found for any visits on 11/24/21. ? Assessment & Plan  ?  ? ?Problem List Items Addressed This Visit   ?None ?Visit Diagnoses   ? ? Acute non-infective otitis externa of right ear, unspecified type    -  Primary ?Acute, new problem ?Erythema noted in right  ear canal consistent with mild otitis externa  ?Recommend refraining from inserting anything in ear canal  ?Will provide Acetic acid- Hydrocortisone OTIC solution to assist with discomfort  ?Follow up as needed for persistent or worsening symptoms.   ? Relevant Medications  ? acetic acid-hydrocortisone (VOSOL-HC) OTIC solution  ? ?  ? ? ? ?No follow-ups on file. ? ? ?I, Izack Hoogland E Alissa Pharr, PA-C, have reviewed all documentation for this visit. The documentation on 11/24/21 for the exam, diagnosis, procedures, and orders are all accurate and complete. ? ? ?Bradden Tadros, Mirian Mo MPH ?[facility] ?[facility] ? ?No follow-ups on file.  ?   ? ? ? ? ?

## 2021-11-24 NOTE — Patient Instructions (Addendum)
Please use the ear drops to help with the ear discomfort ? ?Let us know if the sore throat returns or becomes worse so we can evaluate  ? ?It was nice to meet you and I appreciate the opportunity to be involved in your care ? ?

## 2022-01-09 ENCOUNTER — Other Ambulatory Visit: Payer: Self-pay | Admitting: Internal Medicine

## 2022-01-11 NOTE — Telephone Encounter (Signed)
Requested medication (s) are due for refill today:   Yes ? ?Requested medication (s) are on the active medication list:   Yes ? ?Future visit scheduled:   No.   Left a voicemail to call and schedule a physical.  Been over a year. ? ? ?Last ordered: 12/31/2020 #90, 3 refills ? ?Returned because per note no refills until physical.   Provider to review for refills prior to appt.  ? ?Requested Prescriptions  ?Pending Prescriptions Disp Refills  ? omeprazole (PRILOSEC) 20 MG capsule [Pharmacy Med Name: OMEPRAZOLE 20MG  CAPSULES] 90 capsule 3  ?  Sig: TAKE 1 CAPSULE(20 MG) BY MOUTH DAILY  ?  ? Gastroenterology: Proton Pump Inhibitors Passed - 01/09/2022  3:28 AM  ?  ?  Passed - Valid encounter within last 12 months  ?  Recent Outpatient Visits   ? ?      ? 1 month ago Acute non-infective otitis externa of right ear, unspecified type  ? [facility] Mecum, Dani Gobble, PA-C  ? 1 year ago Encounter for general adult medical examination with abnormal findings  ? [facility] Burdett, Coralie Keens, NP  ? ?  ?  ? ? ?  ?  ?  ? ?

## 2022-02-13 ENCOUNTER — Ambulatory Visit: Payer: Self-pay | Admitting: *Deleted

## 2022-02-13 NOTE — Telephone Encounter (Signed)
Reason for Disposition  [1] MILD pain (e.g., does not interfere with normal activities) AND [2] present > 7 days  Answer Assessment - Initial Assessment Questions 1. ONSET: "When did the pain start?"      He has had pain in big toe and down side of foot in both feet. 2. LOCATION: "Where is the pain located?"   (e.g., around nail, entire toe, at foot joint)      The pain is getting worse.   3. PAIN: "How bad is the pain?"    (Scale 1-10; or mild, moderate, severe)   -  MILD (1-3): doesn't interfere with normal activities    -  MODERATE (4-7): interferes with normal activities (e.g., work or school) or awakens from sleep, limping    -  SEVERE (8-10): excruciating pain, unable to do any normal activities, unable to walk     Depends on time of day.   It fluctuates.   Does not have diabetes 4. APPEARANCE: "What does the toe look like?" (e.g., redness, swelling, bruising, pallor)     No wounds or sores 5. CAUSE: "What do you think is causing the toe pain?"     I don't know.    6. OTHER SYMPTOMS: "Do you have any other symptoms?" (e.g., leg pain, rash, fever, numbness)     Have sensitive feet.  Hands feel numb in mornings for last 2 months.    7. PREGNANCY: "Is there any chance you are pregnant?" "When was your last menstrual period?"     N/A  Protocols used: Toe Pain-A-AH  Chief Complaint: both feet and big toes hurting and sensitive for several months.  Both hands numb in the mornings for 2 months Symptoms: big toes going down side of both feet are painful with tingling/numbness Frequency: fluctuates and depends on time of day for foot discomfort. Pertinent Negatives: Patient denies wounds, injuries or having diabetes Disposition: [] ED /[] Urgent Care (no appt availability in office) / [x] Appointment(In office/virtual)/ []  [facility]/ [] Home Care/ [] Refused Recommended Disposition /[] [facility]/ []  Follow-up with PCP Additional Notes: Appt made for 02/14/2022 with  , NP at 10/40.

## 2022-02-14 ENCOUNTER — Encounter: Payer: Self-pay | Admitting: Internal Medicine

## 2022-02-14 ENCOUNTER — Ambulatory Visit (INDEPENDENT_AMBULATORY_CARE_PROVIDER_SITE_OTHER): Payer: 59 | Admitting: Internal Medicine

## 2022-02-14 VITALS — BP 104/78 | HR 54 | Temp 97.1°F | Wt 197.0 lb

## 2022-02-14 DIAGNOSIS — E7800 Pure hypercholesterolemia, unspecified: Secondary | ICD-10-CM

## 2022-02-14 DIAGNOSIS — M25832 Other specified joint disorders, left wrist: Secondary | ICD-10-CM | POA: Diagnosis not present

## 2022-02-14 DIAGNOSIS — Z6829 Body mass index (BMI) 29.0-29.9, adult: Secondary | ICD-10-CM

## 2022-02-14 DIAGNOSIS — M19072 Primary osteoarthritis, left ankle and foot: Secondary | ICD-10-CM

## 2022-02-14 DIAGNOSIS — M19079 Primary osteoarthritis, unspecified ankle and foot: Secondary | ICD-10-CM | POA: Insufficient documentation

## 2022-02-14 DIAGNOSIS — N183 Chronic kidney disease, stage 3 unspecified: Secondary | ICD-10-CM | POA: Insufficient documentation

## 2022-02-14 DIAGNOSIS — N1831 Chronic kidney disease, stage 3a: Secondary | ICD-10-CM

## 2022-02-14 DIAGNOSIS — E663 Overweight: Secondary | ICD-10-CM

## 2022-02-14 DIAGNOSIS — M19071 Primary osteoarthritis, right ankle and foot: Secondary | ICD-10-CM | POA: Diagnosis not present

## 2022-02-14 DIAGNOSIS — M25831 Other specified joint disorders, right wrist: Secondary | ICD-10-CM

## 2022-02-14 DIAGNOSIS — K219 Gastro-esophageal reflux disease without esophagitis: Secondary | ICD-10-CM | POA: Diagnosis not present

## 2022-02-14 MED ORDER — GABAPENTIN 100 MG PO CAPS: 100.0000 mg | ORAL_CAPSULE | Freq: Every day | ORAL | 0 refills | Status: AC

## 2022-02-14 NOTE — Assessment & Plan Note (Signed)
Avoid foods that trigger reflux Encourage weight loss as this can help reflux symptoms Continue omeprazole as needed

## 2022-02-14 NOTE — Assessment & Plan Note (Signed)
C-Met today Encourage adequate water intake Avoid NSAIDs OTC

## 2022-02-14 NOTE — Assessment & Plan Note (Signed)
Encourage diet and exercise for weight loss 

## 2022-02-14 NOTE — Assessment & Plan Note (Signed)
He does not complain of significant pain today We will continue to monitor

## 2022-02-14 NOTE — Patient Instructions (Signed)
Cubital Tunnel Syndrome  Cubital tunnel syndrome is a condition that causes pain and weakness of the forearm and hand. It happens when one of the nerves that runs along the inside of the elbow joint (ulnar nerve) becomes irritated. This condition is usually caused by repeated arm motions that are done during sports or work-related activities. What are the causes? This condition may be caused by: Increased pressure on the ulnar nerve at the elbow, arm, or forearm. This can result from: Irritation caused by repeated elbow bending. Poorly healed elbow fractures. Tumors in the elbow. These are usually noncancerous (benign). Scar tissue that develops in the elbow after an injury. Bony growths (spurs) near the ulnar nerve. Stretching of the nerve due to loose elbow ligaments. Trauma to the nerve at the elbow. What increases the risk? The following factors may make you more likely to develop this condition: Doing manual labor that requires frequent bending of the elbow. Playing sports that include repeated or strenuous throwing motions, such as baseball. Playing contact sports, such as football or lacrosse. Not warming up properly before activities. Having diabetes. Having an underactive thyroid (hypothyroidism). What are the signs or symptoms? Symptoms of this condition include: Clumsiness or weakness of the hand. Tenderness of the inner elbow. Aching or soreness of the inner elbow, forearm, or fingers, especially the little finger or the ring finger. Increased pain when forcing the elbow to bend. Reduced control when throwing objects. Tingling, numbness, or a burning feeling inside the forearm or in part of the hand or fingers, especially the little finger or the ring finger. Sharp pains that shoot from the elbow down to the wrist and hand. The inability to grip or pinch hard. How is this diagnosed? This condition is diagnosed based on: Your symptoms and medical history. Your health care  provider will also ask for details about any injury. A physical exam. You may also have tests, including: Electromyogram (EMG). This test measures electrical signals sent by your nerves into the muscles. Nerve conduction study. This test measures how well electrical signals pass through your nerves. Imaging tests, such as X-rays, ultrasound, and MRI. These tests check for possible causes of your condition. How is this treated? This condition may be treated by: Stopping the activities that are causing your symptoms to get worse. Icing and taking medicines to reduce pain and swelling. Wearing a splint to prevent your elbow from bending, or wearing an elbow pad where the ulnar nerve is closest to the skin. Working with a physical therapist in less severe cases. This may help to: Decrease your symptoms. Improve the strength and range of motion of your elbow, forearm, and hand. If these treatments do not help, surgery may be needed. Follow these instructions at home: If you have a splint: Wear the splint as told by your health care provider. Remove it only as told by your health care provider. Loosen the splint if your fingers tingle, become numb, or turn cold and blue. Keep the splint clean. If the splint is not waterproof: Do not let it get wet. Cover it with a watertight covering when you take a bath or shower. Managing pain, stiffness, and swelling  If directed, put ice on the injured area: Put ice in a plastic bag. Place a towel between your skin and the bag. Leave the ice on for 20 minutes, 2-3 times a day. Move your fingers often to avoid stiffness and to lessen swelling. Raise (elevate) the injured area above the level of your  heart while you are sitting or lying down. General instructions Take over-the-counter and prescription medicines only as told by your health care provider. Do any exercise or physical therapy as told by your health care provider. Do not drive or use heavy  machinery while taking prescription pain medicine. If you were given an elbow pad, wear it as told by your health care provider. Keep all follow-up visits as told by your health care provider. This is important. Contact a health care provider if: Your symptoms get worse. Your symptoms do not get better with treatment. You have new pain. Your hand on the injured side feels numb or cold. Summary Cubital tunnel syndrome is a condition that causes pain and weakness of the forearm and hand. You are more likely to develop this condition if you do work or play sports that involve repeated arm movements. This condition is often treated by stopping repetitive activities, applying ice, and using anti-inflammatory medicines. In rare cases, surgery may be needed. This information is not intended to replace advice given to you by your health care provider. Make sure you discuss any questions you have with your health care provider. Document Revised: 10/13/2020 Document Reviewed: 10/13/2020 Elsevier Patient Education  2023 [employer]  

## 2022-02-14 NOTE — Progress Notes (Signed)
Subjective:    Patient ID: Cole Boyle, male    DOB: 02/10/1981, 41 y.o.   MRN: [id number]  HPI  Patient presents to clinic today for follow-up of chronic conditions.    GERD: Triggered by spicy or tomato based foods.  He denies breakthrough on Omeprazole.  There is no upper GI on file.    HLD: His last LDL was 185, triglycerides 113, 12/2020.  He is not taking any cholesterol-lowering medication at this time.  He does not consume a low-fat diet.    CKD 3: His last creatinine was 1.18, GFR 59, 12/2020.  He is not on anything for renal protection.  He does not follow with nephrology.  OA: Mainly in his big toes. He does not take any medications for this.   He also complains of numbness and tingling from his elbows to his fingers.  He noticed this a few months ago but it has seem to worsen. He notices this first thing in the morning but typically goes away within 30 minutes to 1 hour. He denies pain or weakness. He has not noticed any swelling, neck or shoulder pain.  Review of Systems     Past Medical History:  Diagnosis Date   History of shingles     Current Outpatient Medications  Medication Sig Dispense Refill   acetic acid-hydrocortisone (VOSOL-HC) OTIC solution Place 3 drops into the right ear 2 (two) times daily. 10 mL 0   omeprazole (PRILOSEC) 20 MG capsule TAKE 1 CAPSULE(20 MG) BY MOUTH DAILY 30 capsule 0   No current facility-administered medications for this visit.    No Known Allergies  Family History  Problem Relation Age of Onset   Diabetes Father    Kidney disease Father    Cancer Neg Hx    Stroke Neg Hx    Heart disease Neg Hx     Social History   Socioeconomic History   Marital status: Married    Spouse name: Not on file   Number of children: Not on file   Years of education: Not on file   Highest education level: Not on file  Occupational History   Not on file  Tobacco Use   Smoking status: Some Days    Types: Cigars   Smokeless tobacco:  Never  Vaping Use   Vaping Use: Never used  Substance and Sexual Activity   Alcohol use: Yes    Comment: occasional   Drug use: No   Sexual activity: Yes  Other Topics Concern   Not on file  Social History Narrative   Not on file   Social Determinants of Health   Financial Resource Strain: Not on file  Food Insecurity: Not on file  Transportation Needs: Not on file  Physical Activity: Not on file  Stress: Not on file  Social Connections: Not on file  Intimate Partner Violence: Not on file     Constitutional: Denies fever, malaise, fatigue, headache or abrupt weight changes.  HEENT: Denies eye pain, eye redness, ear pain, ringing in the ears, wax buildup, runny nose, nasal congestion, bloody nose, or sore throat. Respiratory: Denies difficulty breathing, shortness of breath, cough or sputum production.   Cardiovascular: Denies chest pain, chest tightness, palpitations or swelling in the hands or feet.  Gastrointestinal: Denies abdominal pain, bloating, constipation, diarrhea or blood in the stool.  GU: Denies urgency, frequency, pain with urination, burning sensation, blood in urine, odor or discharge. Musculoskeletal: Patient reports joint pain in feet.  Denies decrease  in range of motion, difficulty with gait, muscle pain or joint swelling.  Skin: Denies redness, rashes, lesions or ulcercations.  Neurological: Patient reports numbness and tingling from his elbows to fingers.  Denies dizziness, difficulty with memory, difficulty with speech or problems with balance and coordination.  Psych: Denies anxiety, depression, SI/HI.  No other specific complaints in a complete review of systems (except as listed in HPI above).  Objective:   Physical Exam BP 104/78 (BP Location: Left Arm, Patient Position: Sitting, Cuff Size: Normal)   Pulse (!) 54   Temp (!) 97.1 F (36.2 C) (Temporal)   Wt 197 lb (89.4 kg)   SpO2 99%   BMI 29.09 kg/m   Wt Readings from Last 3 Encounters:   11/24/21 200 lb 3.2 oz (90.8 kg)  12/31/20 194 lb 6.4 oz (88.2 kg)  01/24/20 197 lb (89.4 kg)    General: Appears his stated age, overweight, in NAD. Skin: Warm, dry and intact.  HEENT: Head: normal shape and size; Eyes: sclera white, no icterus, conjunctiva pink, PERRLA and EOMs intact;  Cardiovascular: Bradycardic with normal rhythm. S1,S2 noted.  No murmur, rubs or gallops noted. No JVD or BLE edema.  Radial pulses 2+ bilaterally Pulmonary/Chest: Normal effort and positive vesicular breath sounds. No respiratory distress. No wheezes, rales or ronchi noted.  Abdomen: Soft and nontender. Normal bowel sounds.  Musculoskeletal: Normal flexion, extension, rotation of the cervical spine.  Normal internal and external rotation of bilateral shoulders.  Normal flexion, extension and rotation of bilateral elbows.  No joint swelling noted.  No difficulty with gait.  Neurological: Alert and oriented.  Negative Phalen.  Negative Tinel's.  Coordination normal.    BMET    Component Value Date/Time   NA 141 12/31/2020 1540   K 4.3 12/31/2020 1540   CL 104 12/31/2020 1540   CO2 24 12/31/2020 1540   GLUCOSE 108 (H) 12/31/2020 1540   BUN 13 12/31/2020 1540   CREATININE 1.18 12/31/2020 1540   CALCIUM 9.9 12/31/2020 1540   GFRNONAA 59 (L) 01/24/2020 1439   GFRAA >60 01/24/2020 1439    Lipid Panel     Component Value Date/Time   CHOL 251 (H) 12/31/2020 1540   TRIG 113 12/31/2020 1540   HDL 42 12/31/2020 1540   CHOLHDL 6.0 (H) 12/31/2020 1540   VLDL 25.4 03/27/2019 1040   LDLCALC 185 (H) 12/31/2020 1540    CBC    Component Value Date/Time   WBC 7.8 12/31/2020 1540   RBC 4.87 12/31/2020 1540   HGB 15.2 12/31/2020 1540   HCT 45.3 12/31/2020 1540   PLT 256 12/31/2020 1540   MCV 93.0 12/31/2020 1540   MCH 31.2 12/31/2020 1540   MCHC 33.6 12/31/2020 1540   RDW 11.3 12/31/2020 1540    Hgb A1C Lab Results  Component Value Date   HGBA1C 4.7 12/31/2020           Assessment &  Plan:   Ulnar Nerve Impingement, Bilateral:  Referral to orthopedics for further evaluation and treatment Rx for Gabapentin 100 mg nightly as needed-sedation caution given  RTC in 6 months for annual exam Nicki Reaper, NP

## 2022-02-14 NOTE — Assessment & Plan Note (Signed)
C-Met and lipid profile today We will consider statin therapy if LDL >100 Encouraged to consume low-fat diet

## 2022-02-15 ENCOUNTER — Telehealth: Payer: Self-pay

## 2022-02-15 DIAGNOSIS — E7800 Pure hypercholesterolemia, unspecified: Secondary | ICD-10-CM

## 2022-02-15 LAB — COMPLETE METABOLIC PANEL WITH GFR
AG Ratio: 1.3 (calc) (ref 1.0–2.5)
ALT: 19 U/L (ref 9–46)
AST: 21 U/L (ref 10–40)
Albumin: 4.2 g/dL (ref 3.6–5.1)
Alkaline phosphatase (APISO): 51 U/L (ref 36–130)
BUN/Creatinine Ratio: 9 (calc) (ref 6–22)
BUN: 12 mg/dL (ref 7–25)
CO2: 25 mmol/L (ref 20–32)
Calcium: 9.2 mg/dL (ref 8.6–10.3)
Chloride: 108 mmol/L (ref 98–110)
Creat: 1.35 mg/dL — ABNORMAL HIGH (ref 0.60–1.29)
Globulin: 3.3 g/dL (calc) (ref 1.9–3.7)
Glucose, Bld: 85 mg/dL (ref 65–139)
Potassium: 4.1 mmol/L (ref 3.5–5.3)
Sodium: 142 mmol/L (ref 135–146)
Total Bilirubin: 0.5 mg/dL (ref 0.2–1.2)
Total Protein: 7.5 g/dL (ref 6.1–8.1)
eGFR: 68 mL/min/{1.73_m2} (ref >=60)

## 2022-02-15 LAB — LIPID PANEL
Cholesterol: 202 mg/dL — ABNORMAL HIGH (ref <200)
HDL: 40 mg/dL (ref >=40)
LDL Cholesterol (Calc): 141 mg/dL (calc) — ABNORMAL HIGH
Non-HDL Cholesterol (Calc): 162 mg/dL (calc) — ABNORMAL HIGH (ref <130)
Total CHOL/HDL Ratio: 5.1 (calc) — ABNORMAL HIGH (ref <5.0)
Triglycerides: 99 mg/dL (ref <150)

## 2022-02-15 MED ORDER — ATORVASTATIN CALCIUM 10 MG PO TABS
10.0000 mg | ORAL_TABLET | Freq: Every day | ORAL | 0 refills | Status: AC
Start: 2022-02-15 — End: ?

## 2022-02-15 NOTE — Telephone Encounter (Signed)
-----  Message from Jearld Fenton, NP sent at 02/15/2022  8:12 AM EDT ----- Kidney function is slightly decreased.  He should consume adequate amounts of water and avoid anti-inflammatories over-the-counter.  His liver function is normal.  His cholesterol is elevated.  I would recommend cholesterol-lowering medication at this time.  If he is agreeable, send in atorvastatin 10 mg daily, #90, 0 refills.  Have him schedule a 36-month lab only appointment for c-Met and lipid panel.

## 2022-02-15 NOTE — Telephone Encounter (Signed)
Pt advised.  Atorvastatin sent [facility] in [place].  Lab only apt for 05/25/2022.

## 2022-02-28 ENCOUNTER — Ambulatory Visit: Payer: Self-pay | Admitting: *Deleted

## 2022-05-24 ENCOUNTER — Other Ambulatory Visit: Payer: Self-pay

## 2022-05-24 DIAGNOSIS — E7800 Pure hypercholesterolemia, unspecified: Secondary | ICD-10-CM

## 2022-05-25 ENCOUNTER — Other Ambulatory Visit: Payer: 59

## 2022-05-26 LAB — COMPREHENSIVE METABOLIC PANEL
AG Ratio: 1.3 (calc) (ref 1.0–2.5)
ALT: 24 U/L (ref 9–46)
AST: 22 U/L (ref 10–40)
Albumin: 4.1 g/dL (ref 3.6–5.1)
Alkaline phosphatase (APISO): 55 U/L (ref 36–130)
BUN: 14 mg/dL (ref 7–25)
CO2: 29 mmol/L (ref 20–32)
Calcium: 9.1 mg/dL (ref 8.6–10.3)
Chloride: 105 mmol/L (ref 98–110)
Creat: 1.2 mg/dL (ref 0.60–1.29)
Globulin: 3.1 g/dL (calc) (ref 1.9–3.7)
Glucose, Bld: 106 mg/dL — ABNORMAL HIGH (ref 65–99)
Potassium: 3.8 mmol/L (ref 3.5–5.3)
Sodium: 142 mmol/L (ref 135–146)
Total Bilirubin: 0.5 mg/dL (ref 0.2–1.2)
Total Protein: 7.2 g/dL (ref 6.1–8.1)

## 2022-05-26 LAB — LIPID PANEL
Cholesterol: 225 mg/dL — ABNORMAL HIGH (ref <200)
HDL: 36 mg/dL — ABNORMAL LOW (ref >=40)
LDL Cholesterol (Calc): 162 mg/dL (calc) — ABNORMAL HIGH
Non-HDL Cholesterol (Calc): 189 mg/dL (calc) — ABNORMAL HIGH (ref <130)
Total CHOL/HDL Ratio: 6.3 (calc) — ABNORMAL HIGH (ref <5.0)
Triglycerides: 142 mg/dL (ref <150)

## 2023-01-23 ENCOUNTER — Other Ambulatory Visit: Payer: Self-pay | Admitting: Internal Medicine

## 2023-01-23 MED ORDER — OMEPRAZOLE 20 MG PO CPDR: 20.0000 mg | DELAYED_RELEASE_CAPSULE | Freq: Every day | ORAL | 0 refills | Status: DC

## 2023-01-23 NOTE — Telephone Encounter (Signed)
Reordered today 01/23/23 and sent to requesting pharmacy  Requested Prescriptions  Refused Prescriptions Disp Refills   omeprazole (PRILOSEC) 20 MG capsule [Pharmacy Med Name: OMEPRAZOLE 20MG  CAPSULES] 90 capsule     Sig: TAKE 1 CAPSULE(20 MG) BY MOUTH DAILY     Gastroenterology: Proton Pump Inhibitors Passed - 01/23/2023  8:53 AM      Passed - Valid encounter within last 12 months    Recent Outpatient Visits           11 months ago Ulnar impingement syndrome of left upper extremity   [facility] [place], Salvadore Oxford, NP   1 year ago Acute non-infective otitis externa of right ear, unspecified type   [facility] [place], Oswaldo Conroy, [place]   2 years ago Encounter for general adult medical examination with abnormal findings    [facility] [place], Salvadore Oxford, NP

## 2023-02-05 ENCOUNTER — Encounter: Payer: Self-pay | Admitting: Internal Medicine

## 2023-02-06 ENCOUNTER — Telehealth (INDEPENDENT_AMBULATORY_CARE_PROVIDER_SITE_OTHER): Payer: 59 | Admitting: Internal Medicine

## 2023-02-06 DIAGNOSIS — J301 Allergic rhinitis due to pollen: Secondary | ICD-10-CM

## 2023-02-06 MED ORDER — PREDNISONE 10 MG PO TABS: ORAL_TABLET | ORAL | 0 refills | Status: DC

## 2023-02-06 NOTE — Progress Notes (Unsigned)
Virtual Visit via Video Note  I connected with Cole Boyle on 02/06/23 at  4:00 PM EDT by a video enabled telemedicine application and verified that I am speaking with the correct person using two identifiers.  Location: Patient: In his car Provider: Office  Person's participating in this video call: Nicki Reaper, NP and Gwendlyn Deutscher   I discussed the limitations of evaluation and management by telemedicine and the availability of in person appointments. The patient expressed understanding and agreed to proceed.  History of Present Illness:  Patient reports runny nose and cough.  He reports this started 2 weeks ago.  He is blowing clear mucus out of his nose.  The cough is productive of white/green mucus.  He denies headache, nasal congestion, ear pain, sore throat, shortness of breath, chest pain, nausea, vomiting or diarrhea.  He denies fever, chills or body aches.  He has tried Astelin and Sudafed OTC with minimal relief of symptoms.  He has taken a home COVID test which was negative.   Past Medical History:  Diagnosis Date   History of shingles     Current Outpatient Medications  Medication Sig Dispense Refill   atorvastatin (LIPITOR) 10 MG tablet Take 1 tablet (10 mg total) by mouth daily. 90 tablet 0   gabapentin (NEURONTIN) 100 MG capsule Take 1 capsule (100 mg total) by mouth at bedtime. 90 capsule 0   omeprazole (PRILOSEC) 20 MG capsule Take 1 capsule (20 mg total) by mouth daily. 30 capsule 0   No current facility-administered medications for this visit.    No Known Allergies  Family History  Problem Relation Age of Onset   Diabetes Father    Kidney disease Father    Cancer Neg Hx    Stroke Neg Hx    Heart disease Neg Hx     Social History   Socioeconomic History   Marital status: Married    Spouse name: Not on file   Number of children: Not on file   Years of education: Not on file   Highest education level: Not on file  Occupational History   Not on  file  Tobacco Use   Smoking status: Some Days    Types: Cigars   Smokeless tobacco: Never  Vaping Use   Vaping Use: Never used  Substance and Sexual Activity   Alcohol use: Yes    Comment: occasional   Drug use: No   Sexual activity: Yes  Other Topics Concern   Not on file  Social History Narrative   Not on file   Social Determinants of Health   Financial Resource Strain: Not on file  Food Insecurity: Not on file  Transportation Needs: Not on file  Physical Activity: Not on file  Stress: Not on file  Social Connections: Not on file  Intimate Partner Violence: Not on file     Constitutional: Denies fever, malaise, fatigue, headache or abrupt weight changes.  HEENT: Patient reports runny nose.  Denies eye pain, eye redness, ear pain, ringing in the ears, wax buildup, runny nose, nasal congestion, bloody nose, or sore throat. Respiratory: Patient reports cough.  Denies difficulty breathing, shortness of breath.   Cardiovascular: Denies chest pain, chest tightness, palpitations or swelling in the hands or feet.  Gastrointestinal: Denies abdominal pain, bloating, constipation, diarrhea or blood in the stool.   No other specific complaints in a complete review of systems (except as listed in HPI above).    Observations/Objective:  Wt Readings from Last 3 Encounters:  02/14/22 197 lb (89.4 kg)  11/24/21 200 lb 3.2 oz (90.8 kg)  12/31/20 194 lb 6.4 oz (88.2 kg)    General: Appears his stated age, well developed, well nourished in NAD. HEENT: Head: normal shape and size;  Nose: No congestion noted; Throat/Mouth: No hoarseness noted Pulmonary/Chest: Normal effort. No respiratory distress.  Neurological: Alert and oriented.   BMET    Component Value Date/Time   NA 142 05/25/2022 1503   K 3.8 05/25/2022 1503   CL 105 05/25/2022 1503   CO2 29 05/25/2022 1503   GLUCOSE 106 (H) 05/25/2022 1503   BUN 14 05/25/2022 1503   CREATININE 1.20 05/25/2022 1503   CALCIUM 9.1  05/25/2022 1503   GFRNONAA 59 (L) 01/24/2020 1439   GFRAA >60 01/24/2020 1439    Lipid Panel     Component Value Date/Time   CHOL 225 (H) 05/25/2022 1503   TRIG 142 05/25/2022 1503   HDL 36 (L) 05/25/2022 1503   CHOLHDL 6.3 (H) 05/25/2022 1503   VLDL 25.4 03/27/2019 1040   LDLCALC 162 (H) 05/25/2022 1503    CBC    Component Value Date/Time   WBC 7.8 12/31/2020 1540   RBC 4.87 12/31/2020 1540   HGB 15.2 12/31/2020 1540   HCT 45.3 12/31/2020 1540   PLT 256 12/31/2020 1540   MCV 93.0 12/31/2020 1540   MCH 31.2 12/31/2020 1540   MCHC 33.6 12/31/2020 1540   RDW 11.3 12/31/2020 1540    Hgb A1C Lab Results  Component Value Date   HGBA1C 4.7 12/31/2020       Assessment and Plan:  Allergic Rhinitis:  No indication for antibiotics at this time Continue Astelin Add antihistamine such as Zyrtec or Allegra OTC daily x 2 weeks Rx for Pred taper for symptom management  Schedule an appt for your annual exam  Follow Up Instructions:    I discussed the assessment and treatment plan with the patient. The patient was provided an opportunity to ask questions and all were answered. The patient agreed with the plan and demonstrated an understanding of the instructions.   The patient was advised to call back or seek an in-person evaluation if the symptoms worsen or if the condition fails to improve as anticipated.    Nicki Reaper, NP

## 2023-02-07 ENCOUNTER — Encounter: Payer: Self-pay | Admitting: Internal Medicine

## 2023-02-07 NOTE — Patient Instructions (Signed)
Allergic Rhinitis, Adult  Allergic rhinitis is a reaction to allergens. Allergens are things that can cause an allergic reaction. This condition affects the lining inside the nose (mucous membrane). There are two types of allergic rhinitis: Seasonal. This type is also called hay fever. It happens only during some times of the year. Perennial. This type can happen at any time of the year. This condition cannot be spread from person to person (is not contagious). It can be mild, bad, or very bad. It can develop at any age and may be outgrown. What are the causes? Pollen from grasses, trees, and weeds. Other causes can be: Dust mites. Smoke. Mold. Car fumes. The pee (urine), spit, or dander of pets. Dander is dead skin cells from a pet. What increases the risk? You are more likely to develop this condition if: You have allergies in your family. You have problems like allergies in your family. You may have: Swelling of parts of your eyes and eyelids. Asthma. This affects how you breathe. Long-term redness and swelling on your skin. Food allergies. What are the signs or symptoms? The main symptom of this condition is a runny or stuffy nose (nasal congestion). Other symptoms may include: Sneezing or coughing. Itching and tearing of your eyes. Mucus that drips down the back of your throat (postnasal drip). This may cause a sore throat. Trouble sleeping. Feeling tired. Headache. How is this treated? There is no cure for this condition. You should avoid things that you are allergic to. Treatment can help to relieve symptoms. This may include: Medicines that block allergy symptoms, such as anti-inflammatories or antihistamines. These may be given as a shot, nasal spray, or pill. Avoiding things you are allergic to. Medicines that give you some of what you are allergic to over time. This is called immunotherapy. It is done if other treatments do not help. You may get: Shots. Medicine under  your tongue. Stronger medicines, if other treatments do not help. Follow these instructions at home: Avoiding allergens Find out what things you are allergic to and avoid them. To do this, try these things: If you get allergies any time of year: Replace carpet with wood, tile, or vinyl flooring. Carpet can trap pet dander and dust. Do not smoke. Do not allow smoking in your home. Change your heating and air conditioning filters at least once a month. If you get allergies only some times of the year: Keep windows closed when you can. Plan things to do outside when pollen counts are lowest. Check pollen counts before you plan things to do outside. When you come indoors, change your clothes and shower before you sit on furniture or bedding. If you are allergic to a pet: Keep the pet out of your bedroom. Vacuum, sweep, and dust often. General instructions Take over-the-counter and prescription medicines only as told by your doctor. Drink enough fluid to keep your pee pale yellow. Where to find more information American Academy of Allergy, Asthma & Immunology: aaaai.org Contact a doctor if: You have a fever. You get a cough that does not go away. You make high-pitched whistling sounds when you breathe, most often when you breathe out (wheeze). Your symptoms slow you down. Your symptoms stop you from doing your normal things each day. Get help right away if: You are short of breath. This symptom may be an emergency. Get help right away. Call 911. Do not wait to see if the symptom will go away. Do not drive yourself to the   hospital. This information is not intended to replace advice given to you by your health care provider. Make sure you discuss any questions you have with your health care provider. Document Revised: 05/01/2022 Document Reviewed: 05/01/2022 Elsevier Patient Education  2024 Elsevier Inc.  

## 2023-02-19 ENCOUNTER — Other Ambulatory Visit: Payer: Self-pay | Admitting: Internal Medicine

## 2023-02-19 NOTE — Telephone Encounter (Signed)
Requested Prescriptions  Pending Prescriptions Disp Refills   omeprazole (PRILOSEC) 20 MG capsule [Pharmacy Med Name: OMEPRAZOLE 20MG  CAPSULES] 90 capsule 3    Sig: TAKE 1 CAPSULE(20 MG) BY MOUTH DAILY     Gastroenterology: Proton Pump Inhibitors Passed - 02/19/2023  9:04 AM      Passed - Valid encounter within last 12 months    Recent Outpatient Visits           1 week ago Seasonal allergic rhinitis due to pollen   [facility] [place], Salvadore Oxford, NP   1 year ago Ulnar impingement syndrome of left upper extremity   [facility], Salvadore Oxford, NP   1 year ago Acute non-infective otitis externa of right ear, unspecified type   [facility] [place], Oswaldo [place], [place]   2 years ago Encounter for general adult medical examination with abnormal findings   [facility] [place], Salvadore Oxford, NP

## 2023-04-14 ENCOUNTER — Emergency Department
Admission: EM | Admit: 2023-04-14 | Discharge: 2023-04-14 | Disposition: A | Discharge status: Home / Self Care | Payer: 59 | Attending: Emergency Medicine | Admitting: Emergency Medicine

## 2023-04-14 ENCOUNTER — Other Ambulatory Visit: Payer: Self-pay

## 2023-04-14 DIAGNOSIS — S6991XA Unspecified injury of right wrist, hand and finger(s), initial encounter: Secondary | ICD-10-CM | POA: Diagnosis present

## 2023-04-14 DIAGNOSIS — Z23 Encounter for immunization: Secondary | ICD-10-CM | POA: Insufficient documentation

## 2023-04-14 DIAGNOSIS — W293XXA Contact with powered garden and outdoor hand tools and machinery, initial encounter: Secondary | ICD-10-CM | POA: Insufficient documentation

## 2023-04-14 DIAGNOSIS — S61210A Laceration without foreign body of right index finger without damage to nail, initial encounter: Secondary | ICD-10-CM

## 2023-04-14 MED ORDER — TETANUS-DIPHTH-ACELL PERTUSSIS 5-2.5-18.5 LF-MCG/0.5 IM SUSY
0.5000 mL | PREFILLED_SYRINGE | Freq: Once | INTRAMUSCULAR | Status: AC
  Administered 2023-04-14: 0.5 mL via INTRAMUSCULAR
  Filled 2023-04-14 (×2): qty 0.5

## 2023-04-14 MED ORDER — LIDOCAINE HCL (PF) 1 % IJ SOLN
10.0000 mL | Freq: Once | INTRAMUSCULAR | Status: AC
  Administered 2023-04-14: 10 mL
  Filled 2023-04-14: qty 10

## 2023-04-14 MED ORDER — CEPHALEXIN 500 MG PO CAPS: 1000.0000 mg | ORAL_CAPSULE | Freq: Two times a day (BID) | ORAL | 0 refills | Status: DC

## 2023-04-14 NOTE — ED Triage Notes (Signed)
Pt reports was using a chainsaw and cut his right hand index finger. Area has been wrapped and bleeding is controlled.

## 2023-04-14 NOTE — ED Provider Notes (Signed)
[facility] Provider Note  Patient Contact: 8:10 PM (approximate)   History   Finger Injury and Laceration   HPI  Cole Boyle is a 42 y.o. male who presents the emergency department complaining of laceration to index finger of his right hand.  Patient was using a chainsaw when it slipped and cut the index finger.  Full range of motion is preserved.  Patient has bleeding under control.  Unsure last tetanus shot.  No other injury or complaint.     Physical Exam   Triage Vital Signs: ED Triage Vitals [04/14/23 1622]  Encounter Vitals Group     BP      Systolic BP Percentile      Diastolic BP Percentile      Pulse      Resp      Temp      Temp src      SpO2      Weight 196 lb 3.4 oz (89 kg)     Height 5\' 9"  (1.753 m)     Head Circumference      Peak Flow      Pain Score 9     Pain Loc      Pain Education      Exclude from Growth Chart     Most recent vital signs: Vitals:   04/14/23 2135  BP: 126/79  Pulse: 79  Resp: 16  Temp: 98.1 F (36.7 C)  SpO2: 98%     General: Alert and in no acute distress.  Cardiovascular:  Good peripheral perfusion Respiratory: Normal respiratory effort without tachypnea or retractions. Lungs CTAB.  Musculoskeletal: Full range of motion to all extremities.  Visualization of the index finger of the right hand reveals a linear laceration measuring approximately 2 cm in length.  Edges are well-approximated.  No active bleeding.  No visible foreign body.  Good range of motion is preserved. Neurologic:  No gross focal neurologic deficits are appreciated.  Skin:   No rash noted Other:   ED Results / Procedures / Treatments   Labs (all labs ordered are listed, but only abnormal results are displayed) Labs Reviewed - No data to display   EKG     RADIOLOGY    No results found.  PROCEDURES:  Critical Care performed: No  ..Laceration Repair  Date/Time: 04/14/2023 9:13 PM  Performed by:  Racheal Patches, PA-C Authorized by: Racheal Patches, PA-C   Consent:    Consent obtained:  Verbal   Consent given by:  Patient   Risks discussed:  Pain, poor wound healing and infection Universal protocol:    Procedure explained and questions answered to patient or proxy's satisfaction: yes     Immediately prior to procedure, a time out was called: yes     Patient identity confirmed:  Verbally with patient Anesthesia:    Anesthesia method:  Nerve block   Block location:  Index finger right hand   Block needle gauge:  27 G   Block anesthetic:  Lidocaine 1% w/o epi   Block technique:  Digital block   Block injection procedure:  Anatomic landmarks identified, introduced needle, negative aspiration for blood and incremental injection   Block outcome:  Anesthesia achieved Laceration details:    Location:  Finger   Finger location:  R index finger   Length (cm):  2 Exploration:    Hemostasis achieved with:  Direct pressure   Wound extent: no foreign body, no nerve damage, no tendon damage  and no underlying fracture     Contaminated: no   Treatment:    Area cleansed with:  Povidone-iodine and saline   Amount of cleaning:  Standard Skin repair:    Repair method:  Sutures   Suture size:  4-0   Suture material:  Nylon   Suture technique:  Simple interrupted   Number of sutures:  2 Approximation:    Approximation:  Close Repair type:    Repair type:  Simple Post-procedure details:    Dressing:  Open (no dressing)   Procedure completion:  Tolerated well, no immediate complications    MEDICATIONS ORDERED IN ED: Medications  lidocaine (PF) (XYLOCAINE) 1 % injection 10 mL (10 mLs Infiltration Given by Other 04/14/23 2137)  Tdap (BOOSTRIX) injection 0.5 mL (0.5 mLs Intramuscular Given 04/14/23 2136)     IMPRESSION / MDM / ASSESSMENT AND PLAN / ED COURSE  I reviewed the triage vital signs and the nursing notes.                                 Differential  diagnosis includes, but is not limited to, finger laceration, finger fracture, retained foreign body   Patient's presentation is most consistent with acute presentation with potential threat to life or bodily function.   Patient's diagnosis is consistent with finger laceration.  Patient presents emergency department after lacerating his finger on a chainsaw..  Relatively superficial on exam.  Area was cleansed, closed as described above.  Wound care instructions discussed with patient.  Antibiotics prophylactically.  Follow-up with primary care in a week for suture removal.. Patient is given ED precautions to return to the ED for any worsening or new symptoms.     FINAL CLINICAL IMPRESSION(S) / ED DIAGNOSES   Final diagnoses:  Laceration of right index finger without foreign body without damage to nail, initial encounter     Rx / DC Orders   ED Discharge Orders          Ordered    cephALEXin (KEFLEX) 500 MG capsule  2 times daily        04/14/23 2141             Note:  This document was prepared using Dragon voice recognition software and may include unintentional dictation errors.   Lanette Hampshire 04/14/23 2335    Sharyn Creamer, MD 04/15/23 1427

## 2023-04-19 ENCOUNTER — Ambulatory Visit: Payer: 59 | Admitting: Internal Medicine

## 2023-05-17 ENCOUNTER — Ambulatory Visit: Payer: Self-pay

## 2023-05-17 ENCOUNTER — Ambulatory Visit (INDEPENDENT_AMBULATORY_CARE_PROVIDER_SITE_OTHER): Payer: 59 | Admitting: Internal Medicine

## 2023-05-17 ENCOUNTER — Encounter: Payer: Self-pay | Admitting: Internal Medicine

## 2023-05-17 VITALS — BP 124/76 | HR 85 | Temp 95.7°F | Wt 200.0 lb

## 2023-05-17 DIAGNOSIS — R197 Diarrhea, unspecified: Secondary | ICD-10-CM | POA: Diagnosis not present

## 2023-05-17 DIAGNOSIS — R1084 Generalized abdominal pain: Secondary | ICD-10-CM

## 2023-05-17 DIAGNOSIS — R140 Abdominal distension (gaseous): Secondary | ICD-10-CM

## 2023-05-17 DIAGNOSIS — Z789 Other specified health status: Secondary | ICD-10-CM | POA: Diagnosis not present

## 2023-05-17 NOTE — Telephone Encounter (Signed)
Summary: diarrhea   Pt's wife called saying husband has had diarrhea x 4 days.  There are no appts until next week  CB@   [phone]     Chief Complaint: Diarrhea - 7-8 watery stools a day. Recent travel to [place]. Symptoms: Mild cramps Frequency: 4 days Pertinent Negatives: Patient denies fever Disposition: [] ED /[] Urgent Care (no appt availability in office) / [x] Appointment(In office/virtual)/ []  [facility]/ [] Home Care/ [] Refused Recommended Disposition /[] [facility]/ []  Follow-up with PCP Additional Notes: Pt. Agrees with appointment today.  Reason for Disposition  [1] SEVERE diarrhea (e.g., 7 or more times / day more than normal) AND [2] present > 24 hours (1 day)  Answer Assessment - Initial Assessment Questions 1. DIARRHEA SEVERITY: "How bad is the diarrhea?" "How many more stools have you had in the past 24 hours than normal?"    - NO DIARRHEA (SCALE 0)   - MILD (SCALE 1-3): Few loose or mushy BMs; increase of 1-3 stools over normal daily number of stools; mild increase in ostomy output.   -  MODERATE (SCALE 4-7): Increase of 4-6 stools daily over normal; moderate increase in ostomy output.   -  SEVERE (SCALE 8-10; OR "WORST POSSIBLE"): Increase of 7 or more stools daily over normal; moderate increase in ostomy output; incontinence.     7-8 2. ONSET: "When did the diarrhea begin?"      4 days ago 3. BM CONSISTENCY: "How loose or watery is the diarrhea?"      Watery 4. VOMITING: "Are you also vomiting?" If Yes, ask: "How many times in the past 24 hours?"      No 5. ABDOMEN PAIN: "Are you having any abdomen pain?" If Yes, ask: "What does it feel like?" (e.g., crampy, dull, intermittent, constant)      Cramps 6. ABDOMEN PAIN SEVERITY: If present, ask: "How bad is the pain?"  (e.g., Scale 1-10; mild, moderate, or severe)   - MILD (1-3): doesn't interfere with normal activities, abdomen soft and not tender to touch    - MODERATE (4-7): interferes  with normal activities or awakens from sleep, abdomen tender to touch    - SEVERE (8-10): excruciating pain, doubled over, unable to do any normal activities       Mild 7. ORAL INTAKE: If vomiting, "Have you been able to drink liquids?" "How much liquids have you had in the past 24 hours?"     Yes 8. HYDRATION: "Any signs of dehydration?" (e.g., dry mouth [not just dry lips], too weak to stand, dizziness, new weight loss) "When did you last urinate?"     No 9. EXPOSURE: "Have you traveled to a foreign country recently?" "Have you been exposed to anyone with diarrhea?" "Could you have eaten any food that was spoiled?"     Yes - [place] 10. ANTIBIOTIC USE: "Are you taking antibiotics now or have you taken antibiotics in the past 2 months?"       No 11. OTHER SYMPTOMS: "Do you have any other symptoms?" (e.g., fever, blood in stool)       No 12. PREGNANCY: "Is there any chance you are pregnant?" "When was your last menstrual period?"       N/a  Protocols used: [facility]

## 2023-05-17 NOTE — Patient Instructions (Signed)
Diarrhea, Adult Diarrhea is when you pass loose and sometimes watery poop (stool) often. Diarrhea can make you feel weak and cause you to lose water in your body (get dehydrated). Losing water in your body can cause you to: Feel tired and thirsty. Have a dry mouth. Go pee (urinate) less often. Diarrhea often lasts 2-3 days. It can last longer if it is a sign of something more serious. Be sure to treat your diarrhea as told by your doctor. Follow these instructions at home: Eating and drinking     Follow these instructions as told by your doctor: Take an ORS (oral rehydration solution). This is a drink that helps you replace fluids and minerals your body lost. It is sold at pharmacies and stores. Drink enough fluid to keep your pee (urine) pale yellow. Drink fluids such as: Water. You can also get fluids by sucking on ice chips. Diluted fruit juice. Low-calorie sports drinks. Milk. Avoid drinking fluids that have a lot of sugar or caffeine in them. These include soda, energy drinks, and regular sports drinks. Avoid alcohol. Eat bland, easy-to-digest foods in small amounts as you are able. These foods include: Bananas. Applesauce. Rice. Low-fat (lean) meats. Toast. Crackers. Avoid spicy or fatty foods.  Medicines Take over-the-counter and prescription medicines only as told by your doctor. If you were prescribed antibiotics, take them as told by your doctor. Do not stop taking them even if you start to feel better. General instructions  Wash your hands often using soap and water for 20 seconds. If soap and water are not available, use hand sanitizer. Others in your home should wash their hands as well. Wash your hands: After using the toilet or changing a diaper. Before preparing, cooking, or serving food. While caring for a sick person. While visiting someone in a hospital. Rest at home while you get better. Take a warm bath to help with any burning or pain from having  diarrhea. Watch your condition for any changes. Contact a doctor if: You have a fever. Your diarrhea gets worse. You have new symptoms. You vomit every time you eat or drink. You feel light-headed, dizzy, or you have a headache. You have muscle cramps. You have signs of losing too much water in your body, such as: Dark pee, very little pee, or no pee. Cracked lips. Dry mouth. Sunken eyes. Sleepiness. Weakness. You have bloody or black poop or poop that looks like tar. You have very bad pain, cramping, or bloating in your belly (abdomen). Your skin feels cold and clammy. You feel confused. Get help right away if: You have chest pain. Your heart is beating very quickly. You have trouble breathing or you are breathing very quickly. You feel very weak or you faint. These symptoms may be an emergency. Get help right away. Call 911. Do not wait to see if the symptoms will go away. Do not drive yourself to the hospital. This information is not intended to replace advice given to you by your health care provider. Make sure you discuss any questions you have with your health care provider. Document Revised: 02/07/2022 Document Reviewed: 02/07/2022 Elsevier Patient Education  2024 [employer].

## 2023-05-17 NOTE — Progress Notes (Signed)
Subjective:    Patient ID: Cole Boyle, male    DOB: 10-29-1980, 42 y.o.   MRN: [id number]  HPI  Patient presents to clinic today with complaint of abdominal cramping and diarrhea.  He reports this started 4 days ago.  He is having 7-8 loose stools per day. He reports his stool is black and watery. He denies foul odor. He reports associated abdominal pain and bloating. He denies nausea or vomiting. He thinks he may have had a temperature on Monday. He has tried pepto bismol with minimal relief of symptoms. He recently traveled to [place], first trip. He did not get his prophylactic travel vaccines.  Review of Systems     Past Medical History:  Diagnosis Date   History of shingles     Current Outpatient Medications  Medication Sig Dispense Refill   atorvastatin (LIPITOR) 10 MG tablet Take 1 tablet (10 mg total) by mouth daily. 90 tablet 0   cephALEXin (KEFLEX) 500 MG capsule Take 2 capsules (1,000 mg total) by mouth 2 (two) times daily. 28 capsule 0   gabapentin (NEURONTIN) 100 MG capsule Take 1 capsule (100 mg total) by mouth at bedtime. 90 capsule 0   omeprazole (PRILOSEC) 20 MG capsule TAKE 1 CAPSULE(20 MG) BY MOUTH DAILY 90 capsule 3   predniSONE (DELTASONE) 10 MG tablet Take 6 tabs on day 1, 5 tabs on day 2, 4 tabs on day 3, 3 tabs on day 4, 2 tabs on day 5, 1 tab on day 6 21 tablet 0   No current facility-administered medications for this visit.    No Known Allergies  Family History  Problem Relation Age of Onset   Diabetes Father    Kidney disease Father    Cancer Neg Hx    Stroke Neg Hx    Heart disease Neg Hx     Social History   Socioeconomic History   Marital status: Married    Spouse name: Not on file   Number of children: Not on file   Years of education: Not on file   Highest education level: Not on file  Occupational History   Not on file  Tobacco Use   Smoking status: Some Days    Types: Cigars   Smokeless tobacco: Never  Vaping Use    Vaping status: Never Used  Substance and Sexual Activity   Alcohol use: Yes    Comment: occasional   Drug use: No   Sexual activity: Yes  Other Topics Concern   Not on file  Social History Narrative   Not on file   Social Determinants of Health   Financial Resource Strain: Not on file  Food Insecurity: Not on file  Transportation Needs: Not on file  Physical Activity: Not on file  Stress: Not on file  Social Connections: Not on file  Intimate Partner Violence: Not on file     Constitutional: Denies fever, malaise, fatigue, headache or abrupt weight changes.  Respiratory: Denies difficulty breathing, shortness of breath, cough or sputum production.   Cardiovascular: Denies chest pain, chest tightness, palpitations or swelling in the hands or feet.  Gastrointestinal: Patient reports abdominal pain, bloating and diarrhea.  Denies constipation, or blood in the stool.  GU: Denies urgency, frequency, pain with urination, burning sensation, blood in urine, odor or discharge. Musculoskeletal: Denies decrease in range of motion, difficulty with gait, muscle pain or joint pain and swelling.  Skin: Denies redness, rashes, lesions or ulcercations.  Neurological: Denies dizziness, difficulty with  memory, difficulty with speech or problems with balance and coordination.   No other specific complaints in a complete review of systems (except as listed in HPI above).  Objective:   Physical Exam  BP 124/76 (BP Location: Left Arm, Patient Position: Sitting, Cuff Size: Normal)   Pulse 85   Temp (!) 95.7 F (35.4 C) (Temporal)   Wt 200 lb (90.7 kg)   SpO2 99%   BMI 29.53 kg/m   Wt Readings from Last 3 Encounters:  04/14/23 196 lb 3.4 oz (89 kg)  02/14/22 197 lb (89.4 kg)  11/24/21 200 lb 3.2 oz (90.8 kg)    General: Appears his stated age, overweight, in NAD. Skin: Warm, dry and intact. No rashes noted. Cardiovascular: Normal rate and rhythm. S1,S2 noted.  No murmur, rubs or gallops  noted.  Pulmonary/Chest: Normal effort and positive vesicular breath sounds. No respiratory distress. No wheezes, rales or ronchi noted.  Abdomen: Distended but soft and generally tender.  Hyperactive bowel sounds. No masses noted. Liver, spleen and kidneys non palpable. Musculoskeletal: No difficulty with gait.  Neurological: Alert and oriented.  BMET    Component Value Date/Time   NA 142 05/25/2022 1503   K 3.8 05/25/2022 1503   CL 105 05/25/2022 1503   CO2 29 05/25/2022 1503   GLUCOSE 106 (H) 05/25/2022 1503   BUN 14 05/25/2022 1503   CREATININE 1.20 05/25/2022 1503   CALCIUM 9.1 05/25/2022 1503   GFRNONAA 59 (L) 01/24/2020 1439   GFRAA >60 01/24/2020 1439    Lipid Panel     Component Value Date/Time   CHOL 225 (H) 05/25/2022 1503   TRIG 142 05/25/2022 1503   HDL 36 (L) 05/25/2022 1503   CHOLHDL 6.3 (H) 05/25/2022 1503   VLDL 25.4 03/27/2019 1040   LDLCALC 162 (H) 05/25/2022 1503    CBC    Component Value Date/Time   WBC 7.8 12/31/2020 1540   RBC 4.87 12/31/2020 1540   HGB 15.2 12/31/2020 1540   HCT 45.3 12/31/2020 1540   PLT 256 12/31/2020 1540   MCV 93.0 12/31/2020 1540   MCH 31.2 12/31/2020 1540   MCHC 33.6 12/31/2020 1540   RDW 11.3 12/31/2020 1540    Hgb A1C Lab Results  Component Value Date   HGBA1C 4.7 12/31/2020           Assessment & Plan:   Generalized abdominal pain, bloating and diarrhea:  Given his recent international travel, concern would be for acute hepatitis, H. pylori, infectious diarrhea, traveler's diarrhea, ova or parasite Will check CBC, c-Met, H. pylori stool antigen, GI pathogen panel, C. Difficile, acute hepatitis panel, ova and parasite Advised him that the black stool is due to the Pepto-Bismol, advised him that he may want to try Imodium in its place Encouraged hydration as diarrhea can be a major risk factor for dehydration Consider CT abdomen/pelvis if symptoms persist or worsen  Schedule an appointment for your  annual exam Nicki Reaper, NP

## 2023-05-17 NOTE — Addendum Note (Signed)
Addended by: Kavin Leech E on: 05/17/2023 02:52 PM   Modules accepted: Orders

## 2023-05-18 LAB — COMPLETE METABOLIC PANEL WITH GFR
AG Ratio: 1.4 (calc) (ref 1.0–2.5)
ALT: 17 U/L (ref 9–46)
AST: 16 U/L (ref 10–40)
Albumin: 3.9 g/dL (ref 3.6–5.1)
Alkaline phosphatase (APISO): 49 U/L (ref 36–130)
BUN: 12 mg/dL (ref 7–25)
CO2: 24 mmol/L (ref 20–32)
Calcium: 8.8 mg/dL (ref 8.6–10.3)
Chloride: 106 mmol/L (ref 98–110)
Creat: 1.17 mg/dL (ref 0.60–1.29)
Globulin: 2.8 g/dL (ref 1.9–3.7)
Glucose, Bld: 94 mg/dL (ref 65–139)
Potassium: 3.9 mmol/L (ref 3.5–5.3)
Sodium: 141 mmol/L (ref 135–146)
Total Bilirubin: 0.5 mg/dL (ref 0.2–1.2)
Total Protein: 6.7 g/dL (ref 6.1–8.1)
eGFR: 80 mL/min/{1.73_m2} (ref >=60)

## 2023-05-18 LAB — CBC
HCT: 41.5 % (ref 38.5–50.0)
Hemoglobin: 13.7 g/dL (ref 13.2–17.1)
MCH: 31.4 pg (ref 27.0–33.0)
MCHC: 33 g/dL (ref 32.0–36.0)
MCV: 95 fL (ref 80.0–100.0)
MPV: 10.7 fL (ref 7.5–12.5)
Platelets: 226 10*3/uL (ref 140–400)
RBC: 4.37 10*6/uL (ref 4.20–5.80)
RDW: 11.3 % (ref 11.0–15.0)
WBC: 6.2 10*3/uL (ref 3.8–10.8)

## 2023-05-18 LAB — HEPATITIS PANEL, ACUTE
Hep A IgM: NONREACTIVE
Hep B C IgM: NONREACTIVE
Hepatitis B Surface Ag: NONREACTIVE
Hepatitis C Ab: NONREACTIVE

## 2023-05-29 LAB — HELICOBACTER PYLORI  SPECIAL ANTIGEN: MICRO NUMBER:: 15507629

## 2023-05-29 LAB — C. DIFFICILE GDH AND TOXIN A/B: MICRO NUMBER:: 15507628

## 2023-06-01 ENCOUNTER — Telehealth: Payer: Self-pay | Admitting: Internal Medicine

## 2023-06-01 NOTE — Telephone Encounter (Signed)
Clydie Braun calling from [facility] is calling to speak to a [employer] authorization for lab work. [phone]

## 2023-06-04 ENCOUNTER — Encounter: Payer: Self-pay | Admitting: Internal Medicine

## 2023-06-05 LAB — OVA AND PARASITE EXAMINATION
CONCENTRATE RESULT:: NONE SEEN
MICRO NUMBER:: 15503223
SPECIMEN QUALITY:: ADEQUATE
TRICHROME RESULT:: NONE SEEN

## 2023-06-05 LAB — GASTROINTESTINAL PATHOGEN PNL
CampyloBacter Group: NOT DETECTED
Norovirus GI/GII: NOT DETECTED
Rotavirus A: NOT DETECTED
Salmonella species: NOT DETECTED
Shiga Toxin 1: NOT DETECTED
Shiga Toxin 2: NOT DETECTED
Shigella Species: NOT DETECTED
Vibrio Group: NOT DETECTED
Yersinia enterocolitica: NOT DETECTED

## 2023-06-05 LAB — C. DIFFICILE GDH AND TOXIN A/B
GDH ANTIGEN: NOT DETECTED
SPECIMEN QUALITY:: ADEQUATE
TOXIN A AND B: NOT DETECTED

## 2023-06-05 LAB — HELICOBACTER PYLORI  SPECIAL ANTIGEN: SPECIMEN QUALITY: ADEQUATE

## 2023-06-05 NOTE — Telephone Encounter (Signed)
Spoke with Clydie Braun. She just needed authorization to work on a PA for lab work.    Thanks,   -Vernona Rieger
